# Patient Record
Sex: Male | Born: 1990 | Race: White | Hispanic: No | Marital: Single | Smoking: Current every day smoker
Health system: Southern US, Community
[De-identification: ages and names within clinical notes are randomized; demographics above are authoritative.]

---
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
## 2012-11-26 ENCOUNTER — Encounter (INDEPENDENT_AMBULATORY_CARE_PROVIDER_SITE_OTHER): Payer: Self-pay | Admitting: General Surgery

## 2012-11-27 ENCOUNTER — Ambulatory Visit (INDEPENDENT_AMBULATORY_CARE_PROVIDER_SITE_OTHER): Payer: BC Managed Care – PPO | Admitting: General Surgery

## 2012-11-27 ENCOUNTER — Encounter (INDEPENDENT_AMBULATORY_CARE_PROVIDER_SITE_OTHER): Payer: Self-pay | Admitting: General Surgery

## 2012-11-27 VITALS — BP 138/80 | HR 60 | Temp 97.6°F | Resp 14 | Ht 72.0 in | Wt 194.8 lb

## 2012-11-27 DIAGNOSIS — S76219A Strain of adductor muscle, fascia and tendon of unspecified thigh, initial encounter: Secondary | ICD-10-CM | POA: Insufficient documentation

## 2012-11-27 DIAGNOSIS — IMO0002 Reserved for concepts with insufficient information to code with codable children: Secondary | ICD-10-CM

## 2012-11-27 DIAGNOSIS — R1031 Right lower quadrant pain: Secondary | ICD-10-CM | POA: Insufficient documentation

## 2012-11-27 NOTE — Patient Instructions (Signed)
Inguinal Strain Your exam shows you have an inguinal strain. This is also known as a pulled groin. This injury is usually due to a pull or partial tear to a muscle or tendon in the groin area. Most groin pulls take several weeks to heal completely. There may be pain with lifting your leg or walking during much of your recovery. Treatment for groin strains includes:  Rest and avoid lifting or performing activities that increase your pain.  Apply ice packs for 20-30 minutes every few hours to reduce pain and swelling over the next 2-3 days.  Medicine to reduce pain and inflammation is often prescribed. HOME CARE INSTRUCTIONS  While most strains in the groin area will heal with rest, you should also watch for any signs of a more serious condition.  SEEK IMMEDIATE MEDICAL CARE IF:   You notice unusual swelling or bulging in the groin.  You have pain or swelling in the testicle.  Blood in your urine.  Marked increased pain.  Weakness or numbness of your leg or abdominal pain. MAKE SURE YOU:   Understand these instructions.  Will watch your condition.  Will get help right away if you are not doing well or get worse. Document Released: 06/13/2004 Document Revised: 07/29/2011 Document Reviewed: 09/10/2007 [employer] Patient Information 2014 [employer].  

## 2012-11-27 NOTE — Progress Notes (Signed)
Patient ID: Clayton Cruz, male   DOB: [birth year], 22 y.o.   MRN: [id number]  Chief Complaint  Patient presents with  . New Evaluation    eval RIH    HPI JUN OSMENT is a 22 y.o. male.   HPI 22 year old Caucasian male referred by [place] snow at [facility] for evaluation of right groin pain. He states that he was seen by a physician at [facility] in 3-4 weeks ago and was diagnosed with a right inguinal hernia. He saw someone most recently at [facility] and was diagnosed with a hernia as well he states. He states he started having problems about a month and a half ago. He reports pain in his right groin and testicle. He also has had some pain in his left lateral abdominal wall. He also reports bilateral groin pain which alternates. It is more noticeable while doing squats. At the gym recently after running he had an extreme amount of pressure and tightness in both groins. He felt better after stretching. He most recently was playing some basketball planted his foot and pivoted and he developed a fair amount of discomfort in his right groin. He denies burning and stinging. He really has not noticed a bulge per se. He had one episode of vomiting due to the pain. He states about 8 months ago he started power lifting. He smokes about half a pack a day. He denies any dysuria. He denies any diarrhea or constipation. He denies any difficulty urinating.  History reviewed. No pertinent past medical history.  Past Surgical History  Procedure Laterality Date  . Knee surgery Left 2006    Family History  Problem Relation Age of Onset  . Hyperlipidemia Father   . Hypertension Father   . Diabetes Maternal Grandmother     DM 2  . Diabetes Maternal Grandfather     DM 2    Social History History  Substance Use Topics  . Smoking status: Current Every Day Smoker -- 0.50 packs/day  . Smokeless tobacco: Never Used  . Alcohol Use: Yes     Comment: heavy beer and liquor    No  Known Allergies  No current outpatient prescriptions on file.   No current facility-administered medications for this visit.    Review of Systems Review of Systems  Constitutional: Negative for fever, chills, appetite change and unexpected weight change.  HENT: Negative for congestion and trouble swallowing.   Eyes: Negative for visual disturbance.  Respiratory: Negative for chest tightness and shortness of breath.   Cardiovascular: Negative for chest pain and leg swelling.       No PND, no orthopnea, no DOE  Gastrointestinal:       See HPI  Genitourinary: Negative for dysuria and hematuria.  Musculoskeletal: Negative.   Skin: Negative for rash.  Neurological: Negative for seizures and speech difficulty.  Hematological: Does not bruise/bleed easily.  Psychiatric/Behavioral: Negative for behavioral problems and confusion.    Blood pressure 138/80, pulse 60, temperature 97.6 F (36.4 C), temperature source Temporal, resp. rate 14, height 6' (1.829 m), weight 194 lb 12.8 oz (88.361 kg).  Physical Exam Physical Exam  Constitutional: He is oriented to person, place, and time. He appears well-developed and well-nourished. No distress.  HENT:  Head: Normocephalic and atraumatic.  Right Ear: External ear normal.  Left Ear: External ear normal.  Eyes: Conjunctivae are normal. No scleral icterus.  Neck: Normal range of motion. Neck supple. No tracheal deviation present. No thyromegaly present.  Cardiovascular:  Normal rate, normal heart sounds and intact distal pulses.   Pulmonary/Chest: Effort normal and breath sounds normal. No respiratory distress. He has no wheezes.  Abdominal: Soft. He exhibits no distension. There is no tenderness. There is no rebound and no guarding. Hernia confirmed negative in the right inguinal area and confirmed negative in the left inguinal area.  Genitourinary: Penis normal. Right testis shows no mass, no swelling and no tenderness. Right testis is  descended. Left testis shows no mass, no swelling and no tenderness. Left testis is descended.  Patient examined supine and standing with and without Valsalva maneuvers. Could not appreciate a bulge or enlarged inguinal ring on exam  Musculoskeletal: Normal range of motion. He exhibits no edema and no tenderness.  Lymphadenopathy:    He has no cervical adenopathy.  Neurological: He is alert and oriented to person, place, and time. He exhibits normal muscle tone.  Skin: Skin is warm and dry. No rash noted. He is not diaphoretic. No erythema. No pallor.  Psychiatric: He has a normal mood and affect. His behavior is normal. Judgment and thought content normal.    Data Reviewed Note from [place] Snow 6/27- no hernia noted on physical exam  Assessment    Right inguinal strain     Plan    I could not feel an inguinal hernia on exam with the patient laying or standing. I explained to him that I thought he had more symptoms consistent with an inguinal strain. I explained it is possible he may have a very early small inguinal hernia; However, I did not appreciate any classic hernia on physical exam.  I told him I would like to start with management for inguinal strain. He was given Agricultural engineer. I told him to avoid heavy lifting, pushing or pulling for 4-6 weeks. I told him he could take NSAIDs as needed. Followup in 6 weeks for repeat evaluation  Mary Sella. Andrey Campanile, MD, FACS General, Bariatric, & Minimally Invasive Surgery [facility] Surgery, [place]        [facility] M 11/27/2012, 1:57 PM

## 2013-01-20 ENCOUNTER — Encounter (INDEPENDENT_AMBULATORY_CARE_PROVIDER_SITE_OTHER): Payer: BC Managed Care – PPO | Admitting: General Surgery

## 2017-04-23 DIAGNOSIS — F331 Major depressive disorder, recurrent, moderate: Secondary | ICD-10-CM | POA: Insufficient documentation

## 2019-08-12 ENCOUNTER — Ambulatory Visit: Payer: Self-pay | Attending: Internal Medicine

## 2019-08-12 DIAGNOSIS — Z23 Encounter for immunization: Secondary | ICD-10-CM

## 2019-08-12 NOTE — Progress Notes (Signed)
   Covid-19 Vaccination Clinic  Name:  KENYEN CANDY    MRN: [id number] DOB: Jun 18, 1990  08/12/2019  Mr. Foor was observed post Covid-19 immunization for 15 minutes without incident. He was provided with Vaccine Information Sheet and instruction to access the V-Safe system.   Mr. Wilsey was instructed to call 911 with any severe reactions post vaccine: Marland Kitchen Difficulty breathing  . Swelling of face and throat  . A fast heartbeat  . A bad rash all over body  . Dizziness and weakness   Immunizations Administered    Name Date Dose VIS Date Route   Moderna COVID-19 Vaccine 08/12/2019  9:48 AM 0.5 mL 04/20/2019 Intramuscular   Manufacturer: Moderna   Lot: [id number]   NDC: 03403-524-81

## 2019-09-15 ENCOUNTER — Ambulatory Visit: Payer: Self-pay | Attending: Internal Medicine

## 2019-09-15 DIAGNOSIS — Z23 Encounter for immunization: Secondary | ICD-10-CM

## 2019-09-15 NOTE — Progress Notes (Signed)
   Covid-19 Vaccination Clinic  Name:  ALISTAR MCENERY    MRN: [id number] DOB: June 01, 1990  09/15/2019  Mr. Gensel was observed post Covid-19 immunization for 15 minutes without incident. He was provided with Vaccine Information Sheet and instruction to access the V-Safe system.   Mr. Vise was instructed to call 911 with any severe reactions post vaccine: Marland Kitchen Difficulty breathing  . Swelling of face and throat  . A fast heartbeat  . A bad rash all over body  . Dizziness and weakness   Immunizations Administered    Name Date Dose VIS Date Route   Moderna COVID-19 Vaccine 09/15/2019  9:53 AM 0.5 mL 04/2019 Intramuscular   Manufacturer: Moderna   Lot: [id number]   NDC: 70220-266-91

## 2020-07-22 ENCOUNTER — Emergency Department: Payer: Self-pay

## 2020-07-22 ENCOUNTER — Observation Stay
Admission: EM | Admit: 2020-07-22 | Discharge: 2020-07-23 | Disposition: A | Discharge status: Home / Self Care | Payer: Self-pay | Attending: Obstetrics and Gynecology | Admitting: Obstetrics and Gynecology

## 2020-07-22 ENCOUNTER — Other Ambulatory Visit: Payer: Self-pay

## 2020-07-22 ENCOUNTER — Encounter: Payer: Self-pay | Admitting: Emergency Medicine

## 2020-07-22 DIAGNOSIS — F17200 Nicotine dependence, unspecified, uncomplicated: Secondary | ICD-10-CM | POA: Diagnosis present

## 2020-07-22 DIAGNOSIS — G35 Multiple sclerosis: Principal | ICD-10-CM | POA: Insufficient documentation

## 2020-07-22 DIAGNOSIS — G379 Demyelinating disease of central nervous system, unspecified: Secondary | ICD-10-CM | POA: Insufficient documentation

## 2020-07-22 DIAGNOSIS — Z20822 Contact with and (suspected) exposure to covid-19: Secondary | ICD-10-CM | POA: Insufficient documentation

## 2020-07-22 DIAGNOSIS — F17210 Nicotine dependence, cigarettes, uncomplicated: Secondary | ICD-10-CM | POA: Insufficient documentation

## 2020-07-22 LAB — DIFFERENTIAL
Abs Immature Granulocytes: 0.06 10*3/uL (ref 0.00–0.07)
Basophils Absolute: 0.1 10*3/uL (ref 0.0–0.1)
Basophils Relative: 1 %
Eosinophils Absolute: 0.2 10*3/uL (ref 0.0–0.5)
Eosinophils Relative: 2 %
Immature Granulocytes: 1 %
Lymphocytes Relative: 34 %
Lymphs Abs: 4.1 10*3/uL — ABNORMAL HIGH (ref 0.7–4.0)
Monocytes Absolute: 0.7 10*3/uL (ref 0.1–1.0)
Monocytes Relative: 6 %
Neutro Abs: 6.9 10*3/uL (ref 1.7–7.7)
Neutrophils Relative %: 56 %

## 2020-07-22 LAB — CBC
HCT: 48.8 % (ref 39.0–52.0)
Hemoglobin: 16.1 g/dL (ref 13.0–17.0)
MCH: 29.5 pg (ref 26.0–34.0)
MCHC: 33 g/dL (ref 30.0–36.0)
MCV: 89.5 fL (ref 80.0–100.0)
Platelets: 214 10*3/uL (ref 150–400)
RBC: 5.45 MIL/uL (ref 4.22–5.81)
RDW: 13.6 % (ref 11.5–15.5)
WBC: 12 10*3/uL — ABNORMAL HIGH (ref 4.0–10.5)
nRBC: 0 % (ref 0.0–0.2)

## 2020-07-22 LAB — COMPREHENSIVE METABOLIC PANEL
ALT: 24 U/L (ref 0–44)
AST: 21 U/L (ref 15–41)
Albumin: 4.6 g/dL (ref 3.5–5.0)
Alkaline Phosphatase: 65 U/L (ref 38–126)
Anion gap: 8 (ref 5–15)
BUN: 18 mg/dL (ref 6–20)
CO2: 25 mmol/L (ref 22–32)
Calcium: 8.7 mg/dL — ABNORMAL LOW (ref 8.9–10.3)
Chloride: 107 mmol/L (ref 98–111)
Creatinine, Ser: 0.76 mg/dL (ref 0.61–1.24)
GFR, Estimated: >60 mL/min (ref >60)
Glucose, Bld: 106 mg/dL — ABNORMAL HIGH (ref 70–99)
Potassium: 4.1 mmol/L (ref 3.5–5.1)
Sodium: 140 mmol/L (ref 135–145)
Total Bilirubin: 0.8 mg/dL (ref 0.3–1.2)
Total Protein: 7.9 g/dL (ref 6.5–8.1)

## 2020-07-22 LAB — RESP PANEL BY RT-PCR (FLU A&B, COVID) ARPGX2
Influenza A by PCR: NEGATIVE
Influenza B by PCR: NEGATIVE
SARS Coronavirus 2 by RT PCR: NEGATIVE

## 2020-07-22 LAB — CBG MONITORING, ED: Glucose-Capillary: 122 mg/dL — ABNORMAL HIGH (ref 70–99)

## 2020-07-22 LAB — PROTIME-INR
INR: 1 (ref 0.8–1.2)
Prothrombin Time: 12.8 seconds (ref 11.4–15.2)

## 2020-07-22 LAB — VITAMIN D 25 HYDROXY (VIT D DEFICIENCY, FRACTURES): Vit D, 25-Hydroxy: 30.18 ng/mL (ref 30–100)

## 2020-07-22 LAB — HIV ANTIBODY (ROUTINE TESTING W REFLEX): HIV Screen 4th Generation wRfx: NONREACTIVE

## 2020-07-22 LAB — APTT: aPTT: 29 seconds (ref 24–36)

## 2020-07-22 IMAGING — MR MR HEAD WO/W CM
15 series · 48 of 48 positions shown · IV contrast (gadavist)
Comparison: None.

CLINICAL DATA: Left arm and leg numbness

EXAM:
MRI HEAD WITHOUT AND WITH CONTRAST
TECHNIQUE: Multiplanar, multiecho pulse sequences of the brain and surrounding
structures were obtained without and with intravenous contrast.
CONTRAST:  9mL GADAVIST GADOBUTROL 1 MMOL/ML IV SOLN

[Series 5: ax dwi_tracew · axial · 3.0mm · 0.65mm/px · z∈[-55,+100]mm · 3 of 48 slices shown]
[im 1/48]
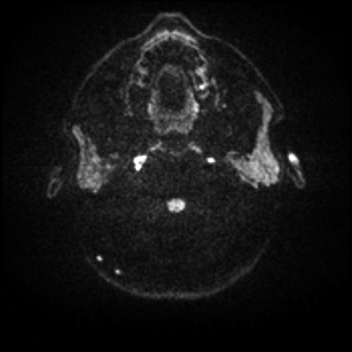
[im 24/48]
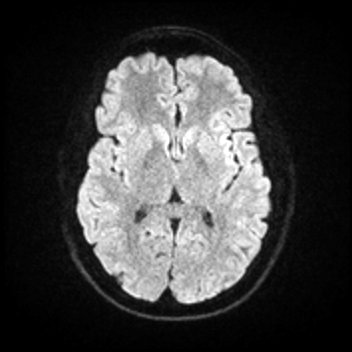
[im 48/48]
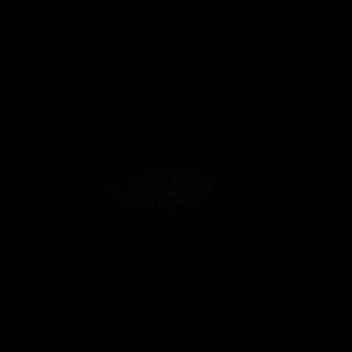

[Series 6: ax dwi_adc · axial · 3.0mm · 0.65mm/px · z∈[-55,+97]mm · 3 of 47 slices shown]
[im 1/47]
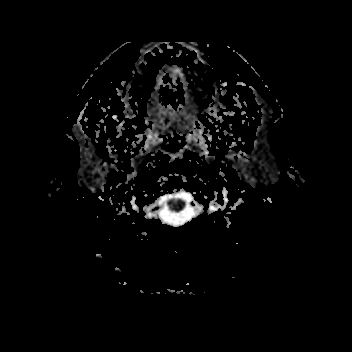
[im 24/47]
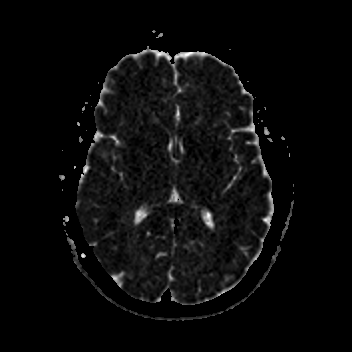
[im 47/47]
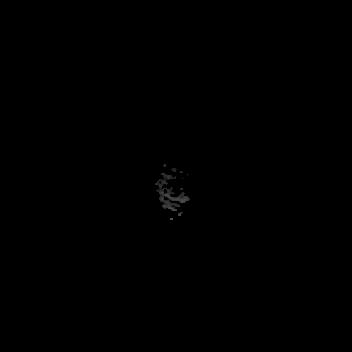

[Series 7: cor dwi_tracew · coronal · 5.0mm · 0.60mm/px · 2 of 38 slices shown]
[im 1/38]
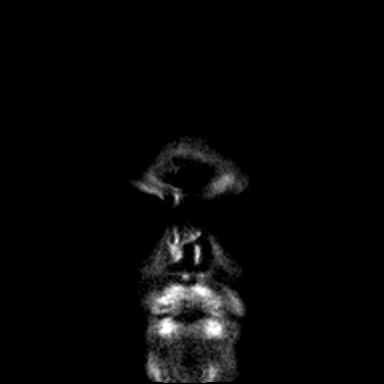
[im 38/38]
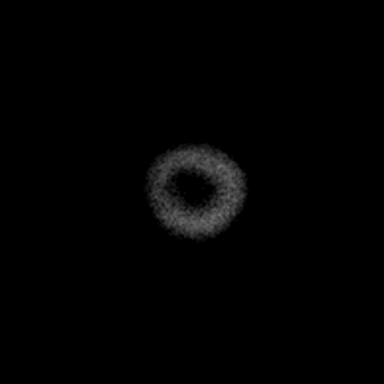

[Series 8: cor dwi_adc · coronal · 5.0mm · 0.60mm/px · 2 of 38 slices shown]
[im 1/38]
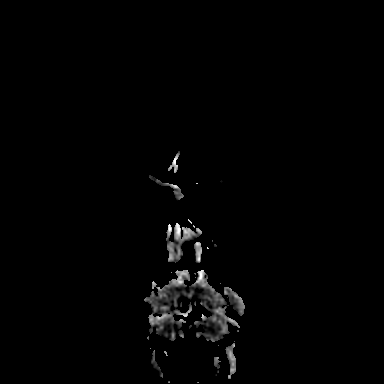
[im 38/38]
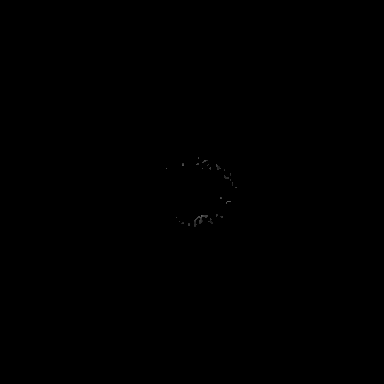

[Series 9: FLAIR · axial · 3.0mm · 0.69mm/px · z∈[-58,+101]mm · 3 of 54 slices shown (1 of 2)]
[im 1/54]
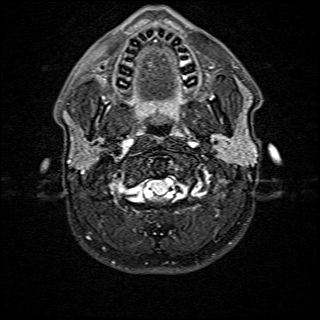
[im 27/54]
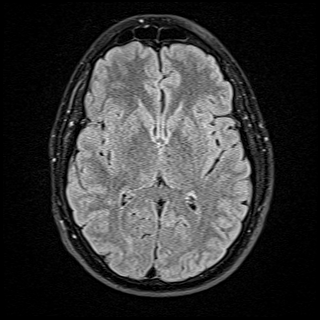
[im 54/54]
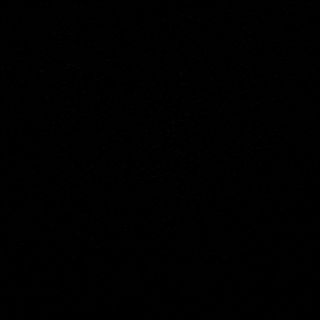

[Series 10: T1 · sagittal · 5.0mm · 0.62mm/px · 1 of 25 slices shown (1 of 2)]
[im 1/25]
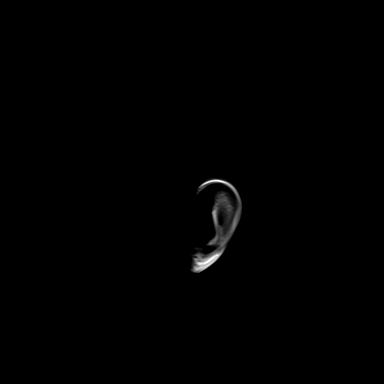

[Series 11: T2 · axial · 5.0mm · 0.53mm/px · 1 of 25 slices shown (1 of 2)]
[im 1/25]
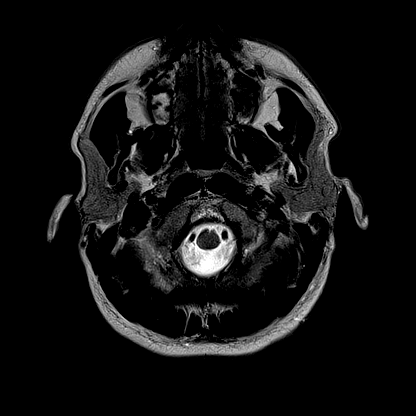

[Series 13: pha_images · axial · 3.0mm · 0.90mm/px · z∈[-65,+112]mm · 3 of 59 slices shown]
[im 1/59]
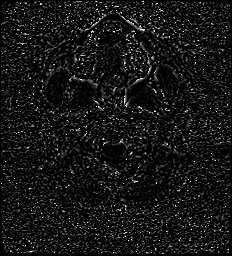
[im 30/59]
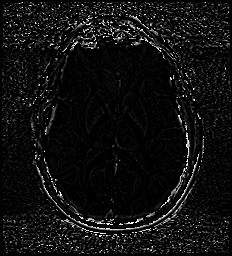
[im 59/59]
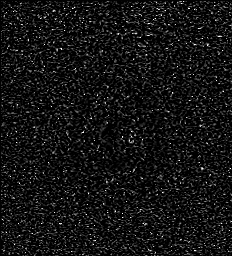

[Series 14: swi_images · axial · 3.0mm · 0.90mm/px · z∈[-65,+112]mm · 3 of 60 slices shown]
[im 1/60]
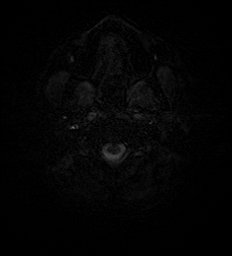
[im 30/60]
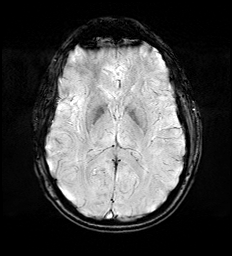
[im 60/60]
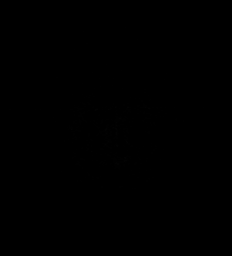

[Series 16: T1 · axial · 1.0mm · 0.98mm/px · z∈[-65,+110]mm · 8 of 176 slices shown (2 of 2)]
[im 1/176]
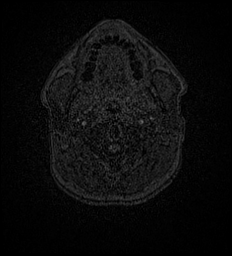
[im 26/176]
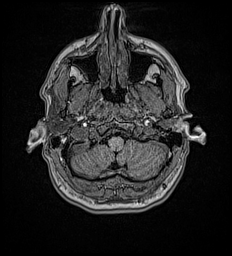
[im 51/176]
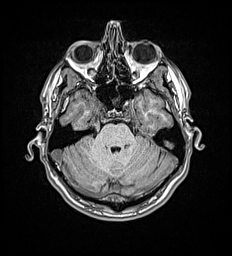
[im 76/176]
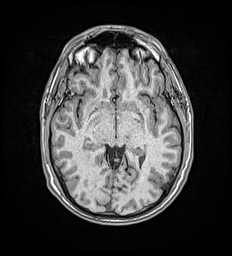
[im 101/176]
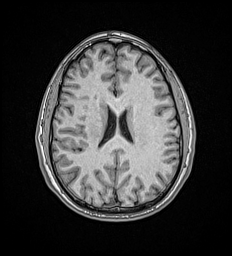
[im 126/176]
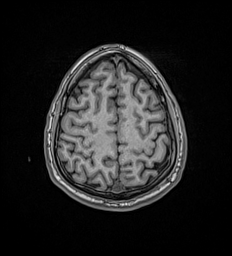
[im 151/176]
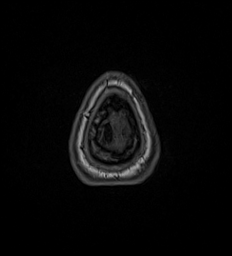
[im 176/176]
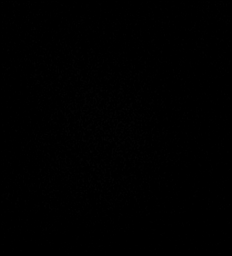

[Series 17: FLAIR · sagittal · 1.0mm · 0.98mm/px · 8 of 176 slices shown (2 of 2)]
[im 1/176]
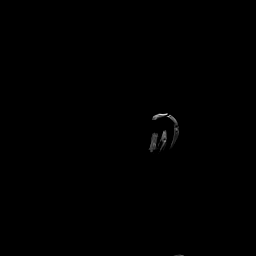
[im 26/176]
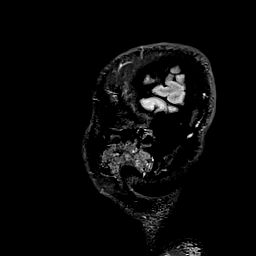
[im 51/176]
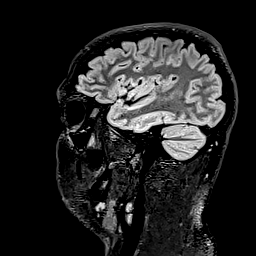
[im 76/176]
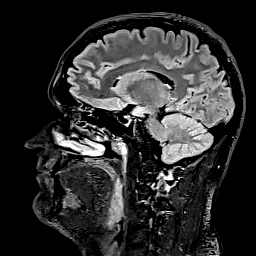
[im 101/176]
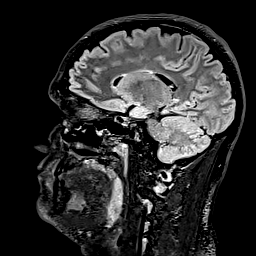
[im 126/176]
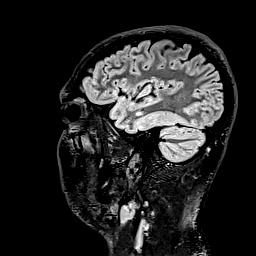
[im 151/176]
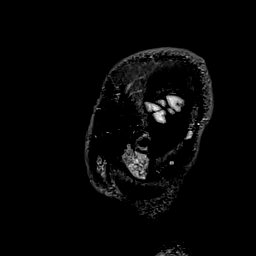
[im 176/176]
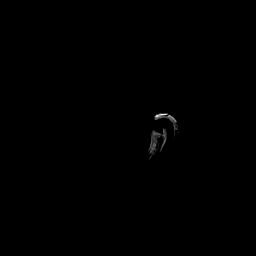

[Series 22: T2 · coronal · 5.0mm · 0.57mm/px · 1 of 31 slices shown (2 of 2)]
[im 1/31]
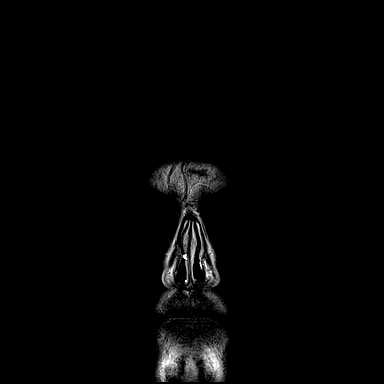

[Series 23: T1 post-contrast · axial · 1.0mm · 0.98mm/px · z∈[-45,+129]mm · 8 of 172 slices shown (1 of 3)]
[im 1/172]
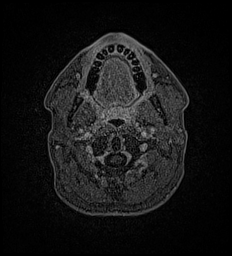
[im 25/172]
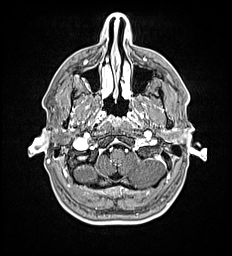
[im 49/172]
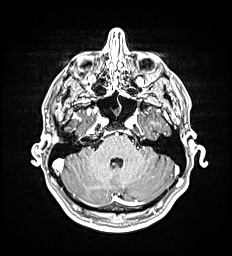
[im 74/172]
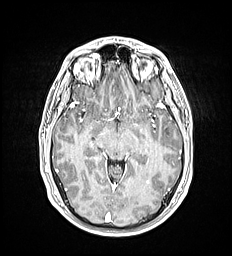
[im 98/172]
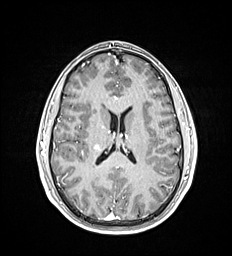
[im 123/172]
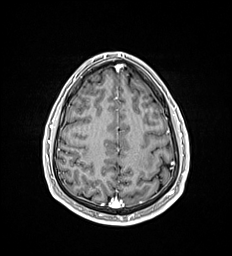
[im 147/172]
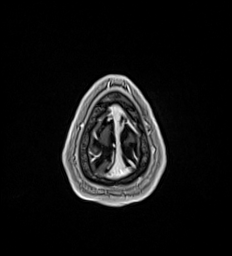
[im 172/172]
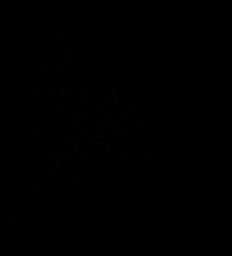

[Series 24: T1 post-contrast · coronal · 5.0mm · 0.57mm/px · 1 of 29 slices shown (2 of 3)]
[im 1/29]
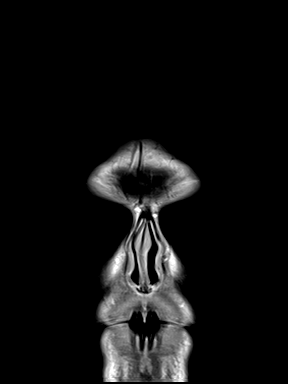

[Series 25: T1 post-contrast · sagittal · 5.0mm · 0.62mm/px · 1 of 23 slices shown (3 of 3)]
[im 1/23]
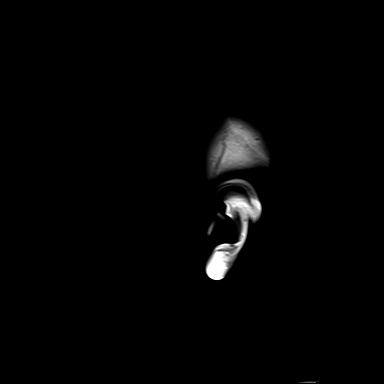

[48 of 48 positions shown; findings below may reference images not displayed]

FINDINGS: Brain: Several foci of T2 hyperintensity are present in the
supratentorial periventricular and subcortical white matter no
brainstem or cerebellar involvement identified. There is associated
enhancement of a right centrum semiovale lesion on series 23, image
114; right corona radiata lesion on image 96; left posterior
temporal lesion on image 75; left anterior temporal lesion on image
65.

There is no acute infarction or intracranial hemorrhage. There is no
intracranial mass or significant mass effect. There is no
hydrocephalus or extra-axial fluid collection. Ventricles and sulci
are normal in size and configuration.

Vascular: Major vessel flow voids at the skull base are preserved.

Skull and upper cervical spine: Normal marrow signal is preserved.

Sinuses/Orbits: Paranasal sinuses are aerated. Orbits are
unremarkable.

Other: Sella is unremarkable.  Mastoid air cells are clear.
IMPRESSION: Mild burden of supratentorial white matter lesions in a pattern
suspicious for primary demyelinating disease such as multiple
sclerosis. Some of the lesions demonstrate enhancement consistent
with active demyelination in this context.

No brainstem or cerebellar involvement.

## 2020-07-22 IMAGING — MR MR CERVICAL SPINE W/ CM
5 of 6 series · 34 of 48 positions shown · IV contrast (gadavist)
Comparison: None.

CLINICAL DATA: Left arm and leg numbness

EXAM:
MRI CERVICAL AND THORACIC WITH CONTRAST
TECHNIQUE: Multiplanar and multiecho pulse sequences of the cervical spine, to
include the craniocervical junction and cervicothoracic junction,
and the thoracic spine, were obtained with intravenous contrast.
CONTRAST:  9mL GADAVIST GADOBUTROL 1 MMOL/ML IV SOLN administered
for brain MRI prior to this study

[Series 1: T2 · sagittal · 3.0mm · 0.62mm/px · 5 of 15 slices shown (1 of 2)]
[im 1/15]
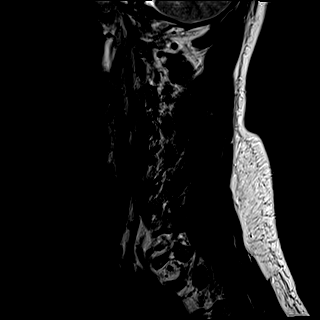
[im 4/15]
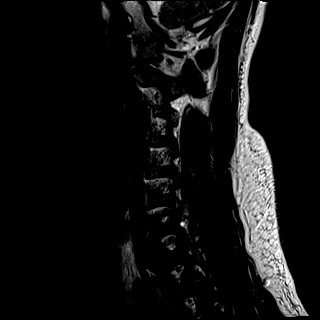
[im 8/15]
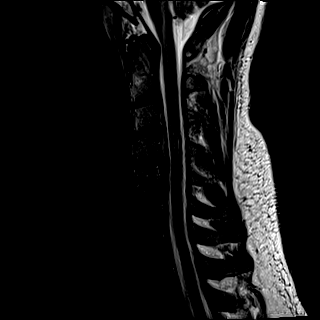
[im 11/15]
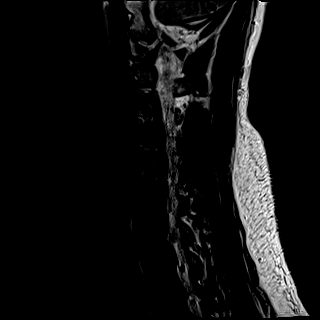
[im 15/15]
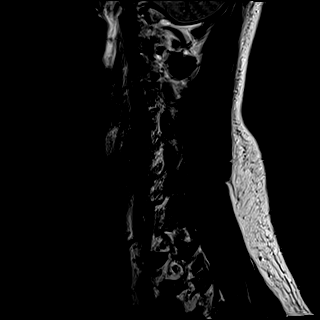

[Series 2: FLAIR fat-sat post-contrast · sagittal · 3.0mm · 0.78mm/px · 2 of 15 slices shown]
[im 1/15]
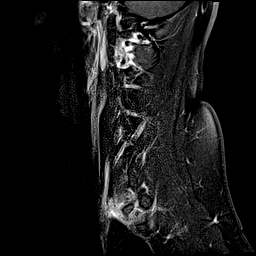
[im 4/15]
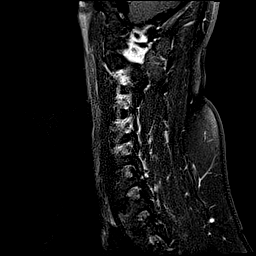

[Series 3: STIR · sagittal · 3.0mm · 0.62mm/px · 5 of 15 slices shown]
[im 1/15]
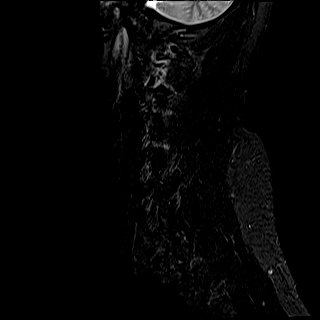
[im 4/15]
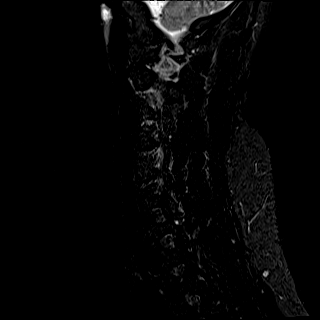
[im 8/15]
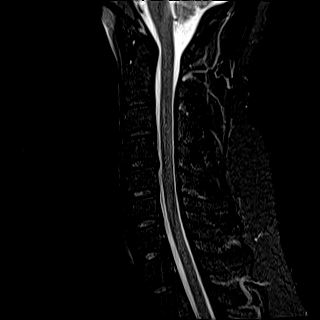
[im 11/15]
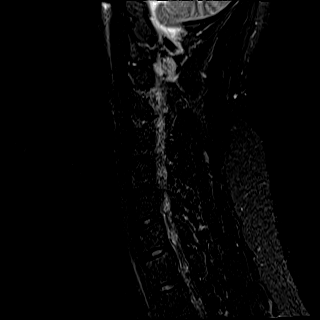
[im 15/15]
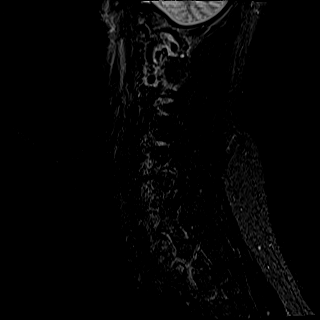

[Series 6: T1 post-contrast · axial · 3.0mm · 0.35mm/px · z∈[-74,+24]mm · 11 of 29 slices shown]
[im 1/29]
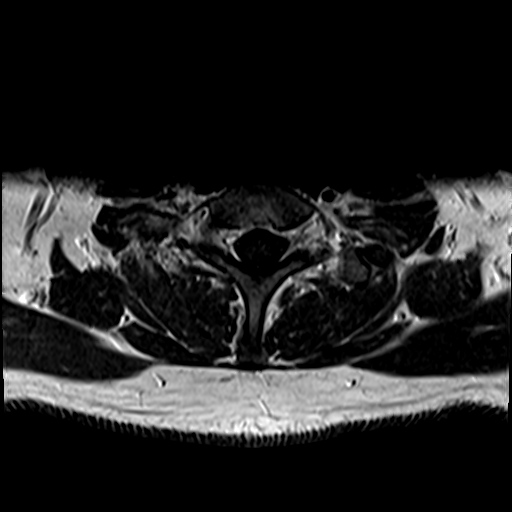
[im 3/29]
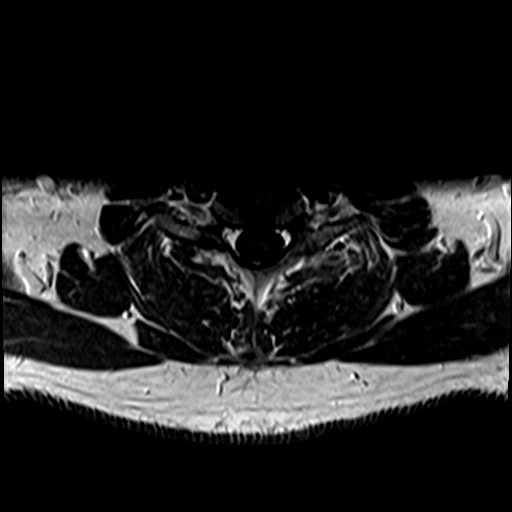
[im 6/29]
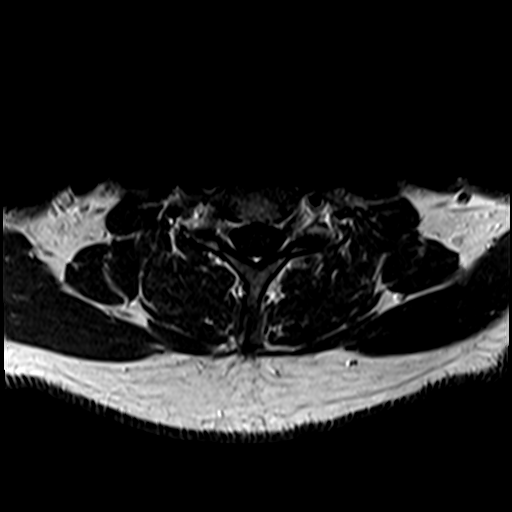
[im 9/29]
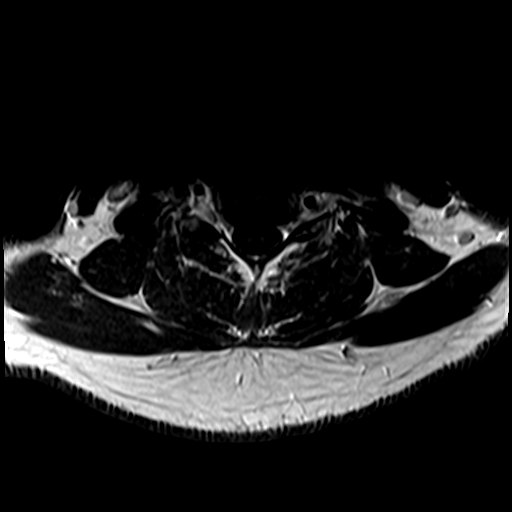
[im 12/29]
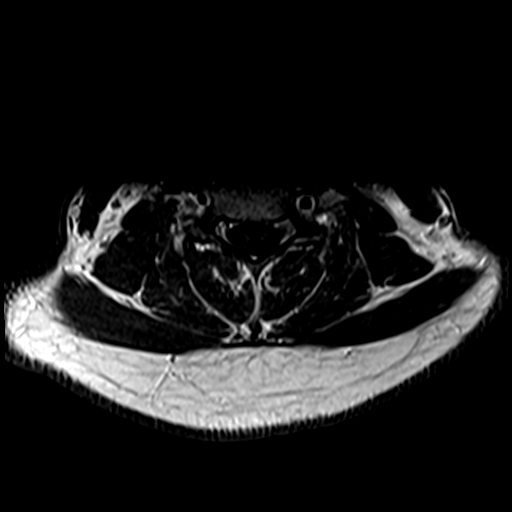
[im 15/29]
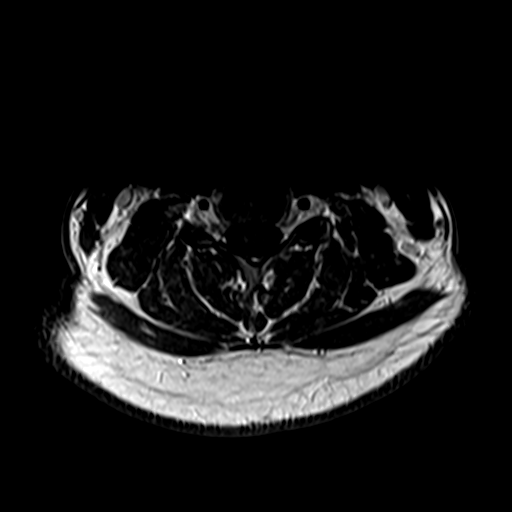
[im 17/29]
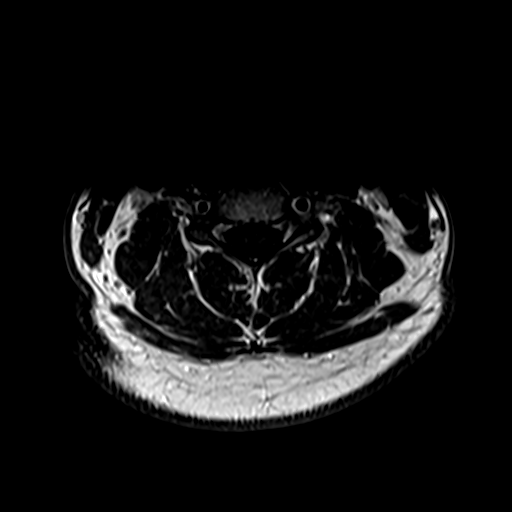
[im 20/29]
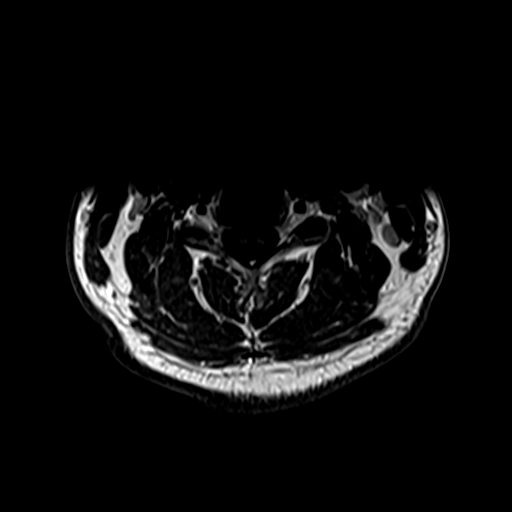
[im 23/29]
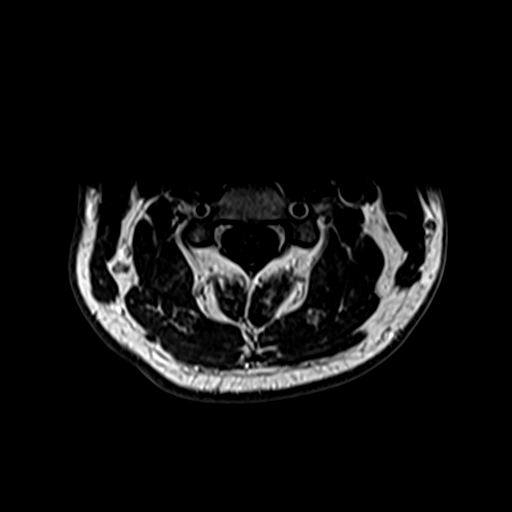
[im 26/29]
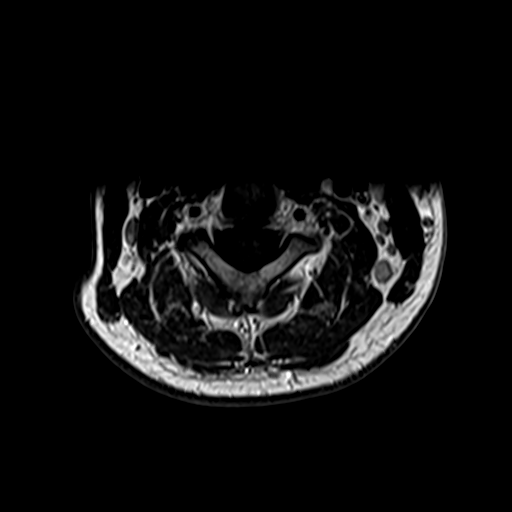
[im 29/29]
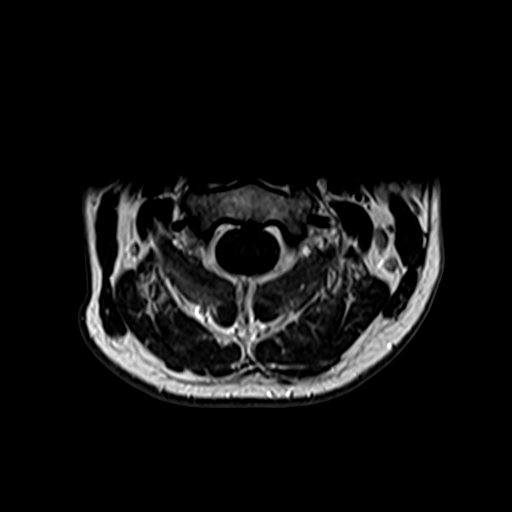

[Series 7: T2 · axial · 3.0mm · 0.70mm/px · z∈[-74,+24]mm · 11 of 29 slices shown (2 of 2)]
[im 1/29]
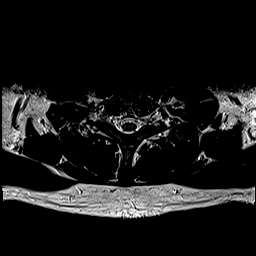
[im 3/29]
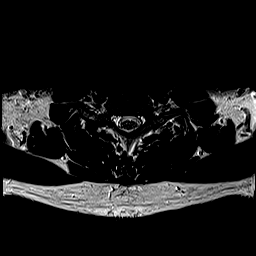
[im 6/29]
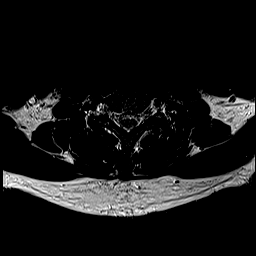
[im 9/29]
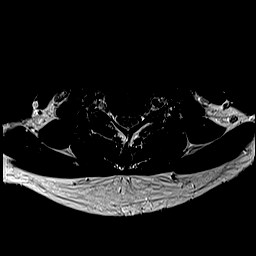
[im 12/29]
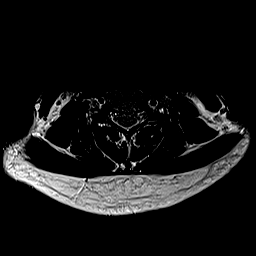
[im 15/29]
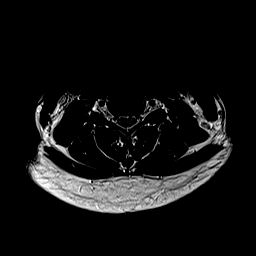
[im 17/29]
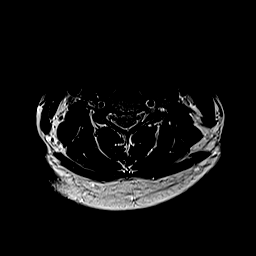
[im 20/29]
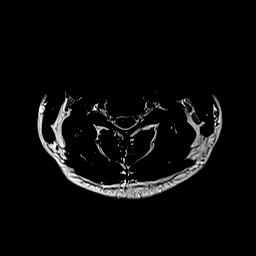
[im 23/29]
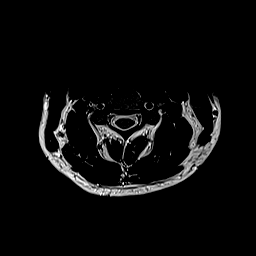
[im 26/29]
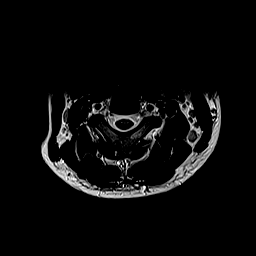
[im 29/29]
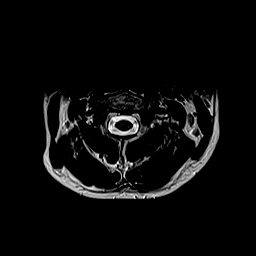

[34 of 48 positions shown; findings below may reference images not displayed]

FINDINGS: MRI CERVICAL SPINE FINDINGS

Alignment: Preserved.

Vertebrae: Vertebral body heights are maintained.

Cord: No abnormal signal or enhancement.

Posterior Fossa, vertebral arteries, paraspinal tissues:
Intracranial findings are discussed separately. Otherwise
unremarkable.

Disc levels: Intervertebral disc heights and signal are maintained.
Minor disc bulge at C4-C5. No canal or foraminal stenosis at any
level.

MRI THORACIC SPINE

Alignment:  Preserved.

Vertebrae: Vertebral body heights are maintained.

Cord:  No definite abnormal signal.  No abnormal enhancement.

Paraspinal and other soft tissues: Unremarkable.

Disc levels: Intervertebral disc heights and signal are maintained.
There is no significant disc herniation. No canal or foraminal
stenosis at any level.
IMPRESSION: No abnormal cord signal or enhancement.

## 2020-07-22 IMAGING — MR MR THORACIC SPINE W/ CM
6 of 7 series · 32 of 48 positions shown · IV contrast (gadavist)
Comparison: None.

CLINICAL DATA: Left arm and leg numbness

EXAM:
MRI CERVICAL AND THORACIC WITH CONTRAST
TECHNIQUE: Multiplanar and multiecho pulse sequences of the cervical spine, to
include the craniocervical junction and cervicothoracic junction,
and the thoracic spine, were obtained with intravenous contrast.
CONTRAST:  9mL GADAVIST GADOBUTROL 1 MMOL/ML IV SOLN administered
for brain MRI prior to this study

[Series 18: T1 · sagittal · 6.0mm · 1.88mm/px · 1 of 9 slices shown]
[im 1/9]
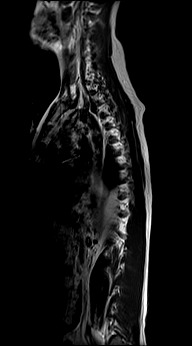

[Series 19: T2 · sagittal · 3.0mm · 1.33mm/px · 4 of 17 slices shown (1 of 2)]
[im 1/17]
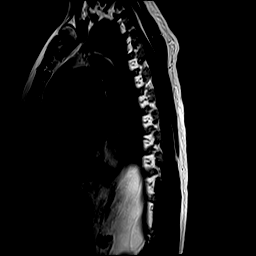
[im 6/17]
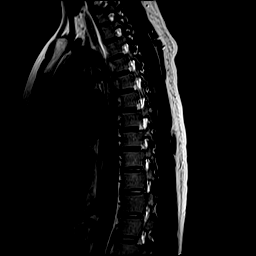
[im 11/17]
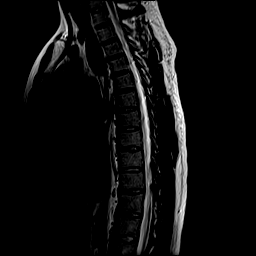
[im 17/17]
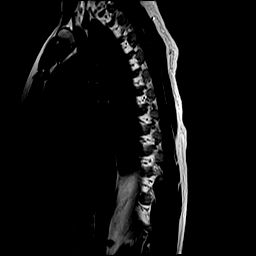

[Series 20: T1 fat-sat post-contrast · sagittal · 3.0mm · 1.33mm/px · 5 of 17 slices shown]
[im 1/17]
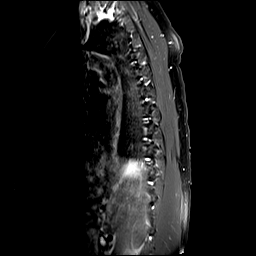
[im 5/17]
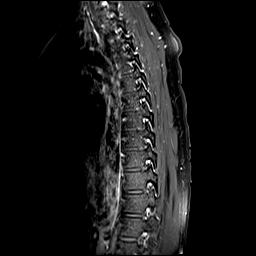
[im 9/17]
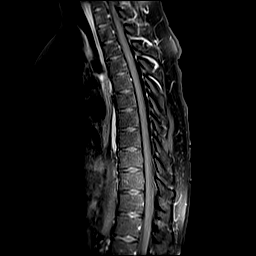
[im 13/17]
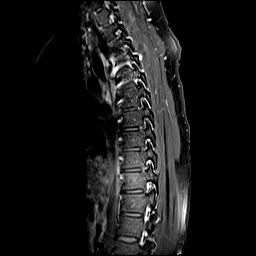
[im 17/17]
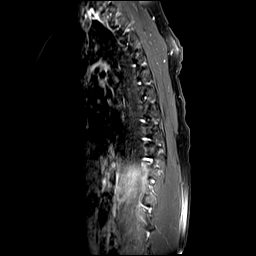

[Series 21: STIR · sagittal · 3.0mm · 0.66mm/px · 5 of 17 slices shown]
[im 1/17]
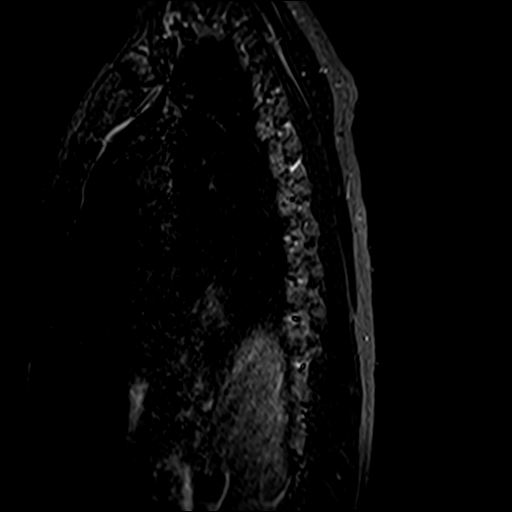
[im 5/17]
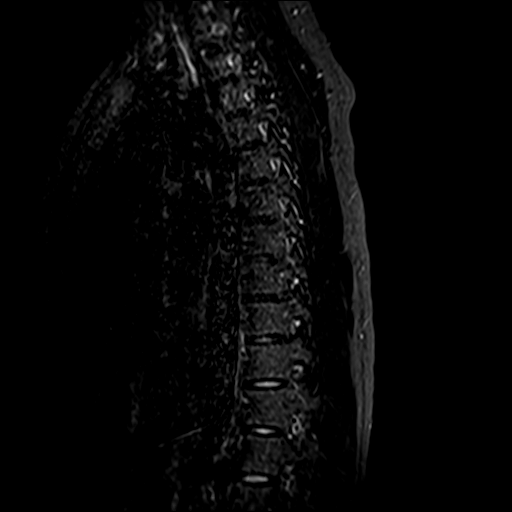
[im 9/17]
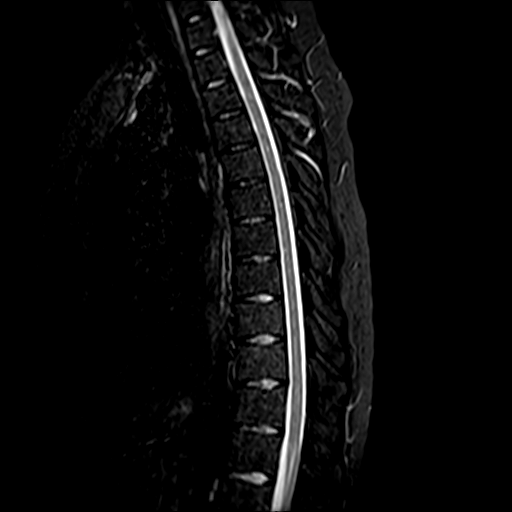
[im 13/17]
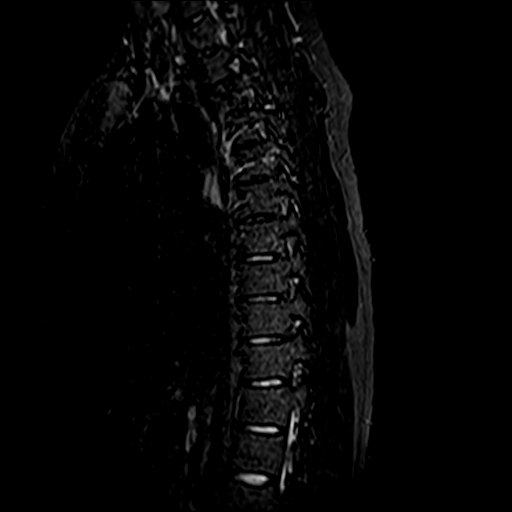
[im 17/17]
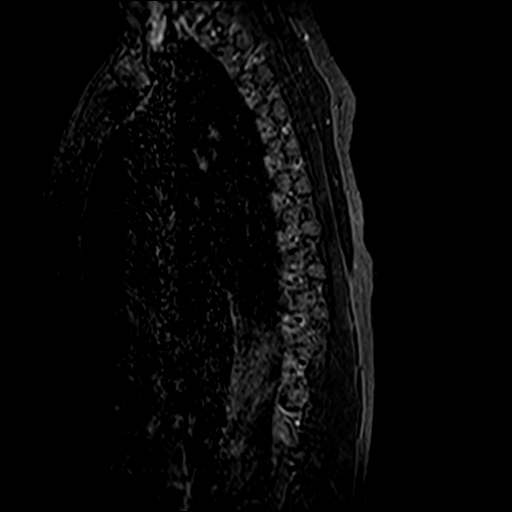

[Series 22: T2 · axial · 4.0mm · 0.59mm/px · z∈[-379,-102]mm · 11 of 39 slices shown (2 of 2)]
[im 1/39]
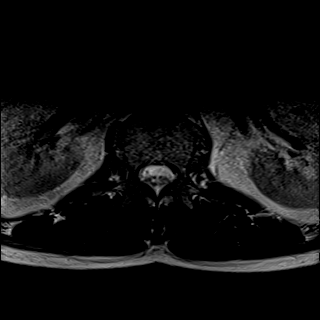
[im 4/39]
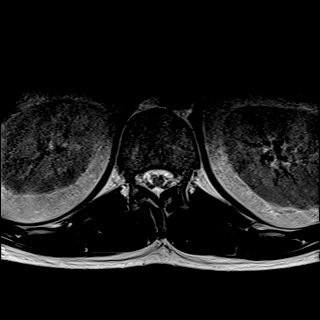
[im 8/39]
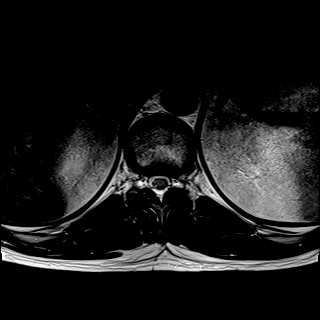
[im 12/39]
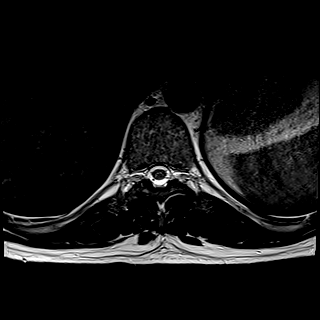
[im 16/39]
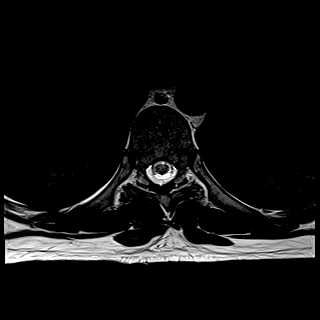
[im 20/39]
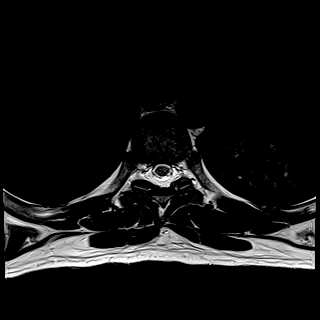
[im 23/39]
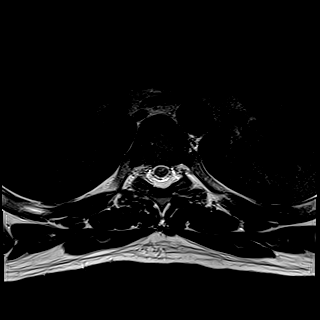
[im 27/39]
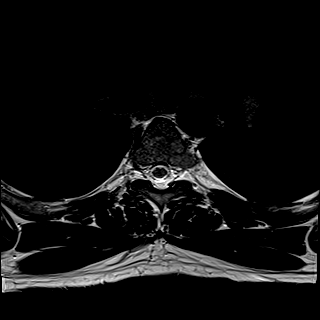
[im 31/39]
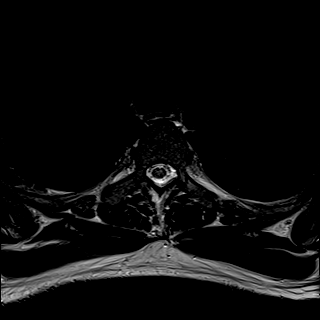
[im 35/39]
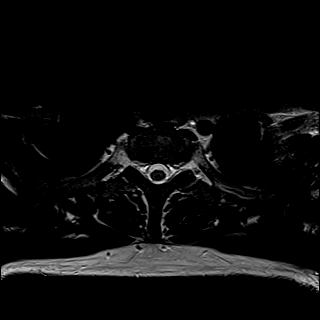
[im 39/39]
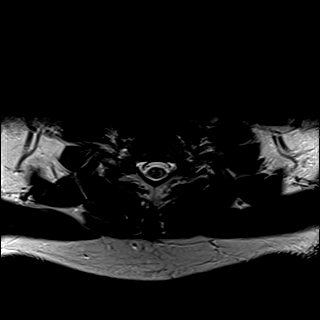

[Series 24: T1 post-contrast · axial · 4.0mm · 0.37mm/px · z∈[-379,-176]mm · 6 of 39 slices shown]
[im 1/39]
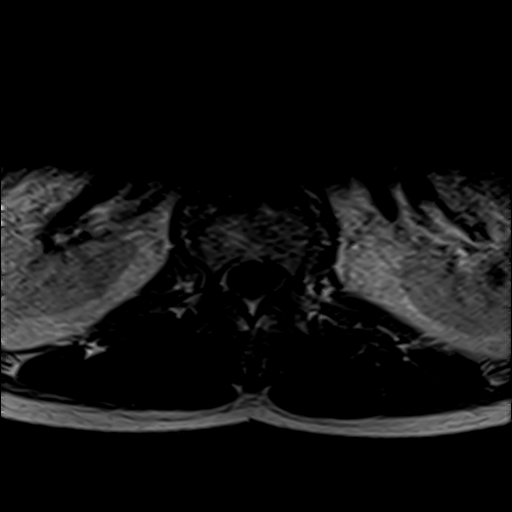
[im 8/39]
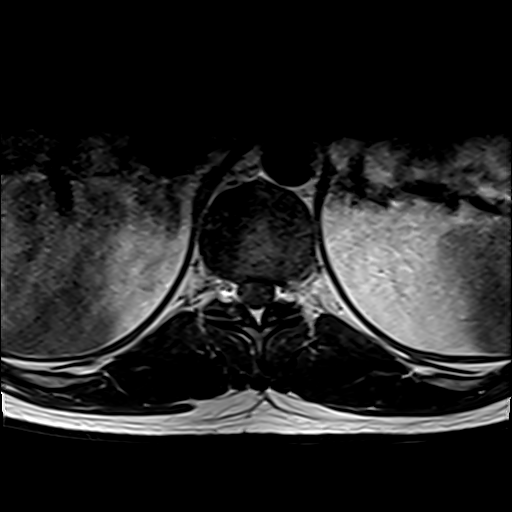
[im 12/39]
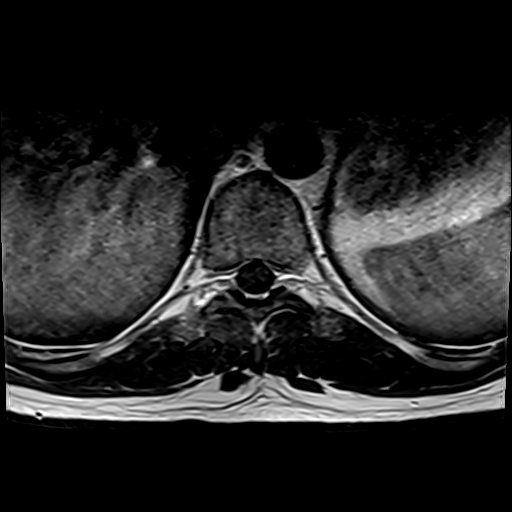
[im 16/39]
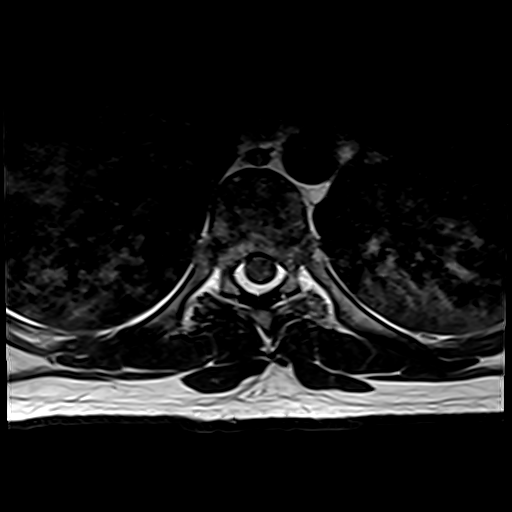
[im 23/39]
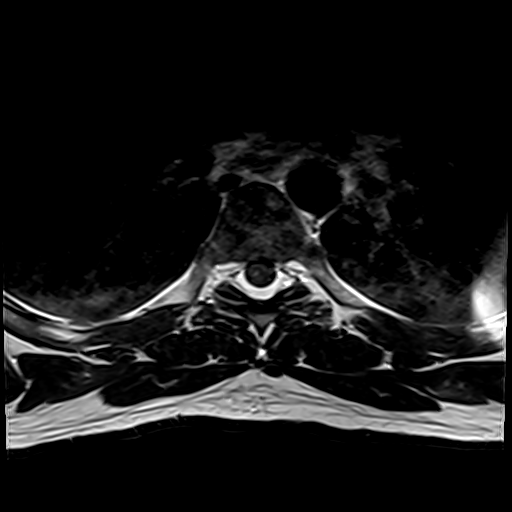
[im 27/39]
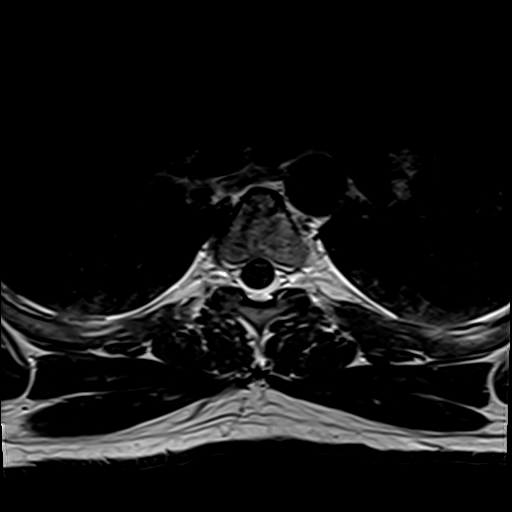

[32 of 48 positions shown; findings below may reference images not displayed]

FINDINGS: MRI CERVICAL SPINE FINDINGS

Alignment: Preserved.

Vertebrae: Vertebral body heights are maintained.

Cord: No abnormal signal or enhancement.

Posterior Fossa, vertebral arteries, paraspinal tissues:
Intracranial findings are discussed separately. Otherwise
unremarkable.

Disc levels: Intervertebral disc heights and signal are maintained.
Minor disc bulge at C4-C5. No canal or foraminal stenosis at any
level.

MRI THORACIC SPINE

Alignment:  Preserved.

Vertebrae: Vertebral body heights are maintained.

Cord:  No definite abnormal signal.  No abnormal enhancement.

Paraspinal and other soft tissues: Unremarkable.

Disc levels: Intervertebral disc heights and signal are maintained.
There is no significant disc herniation. No canal or foraminal
stenosis at any level.
IMPRESSION: No abnormal cord signal or enhancement.

## 2020-07-22 IMAGING — CT CT HEAD W/O CM
3 series · 16 of 47 positions shown, 19 images · non-contrast
Comparison: None.

CLINICAL DATA: Left-sided numbness since yesterday morning

EXAM:
CT HEAD WITHOUT CONTRAST
TECHNIQUE: Contiguous axial images were obtained from the base of the skull
through the vertex without intravenous contrast.

[Series 2: head wo · axial · 0.40mm/px · z∈[-100,+25]mm · 10 of 30 slices shown, 13 images]
[im 3/30  brain]
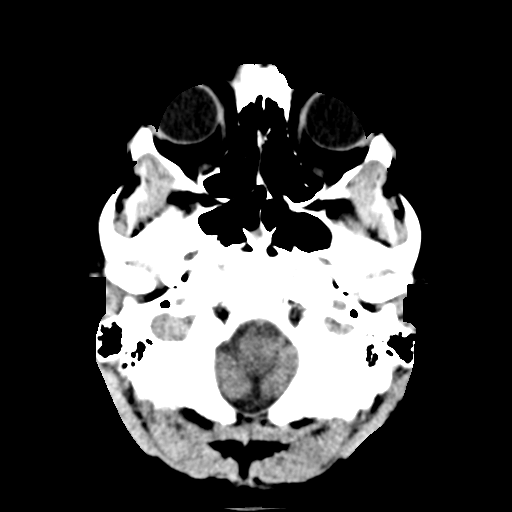
[im 3/30  bone]
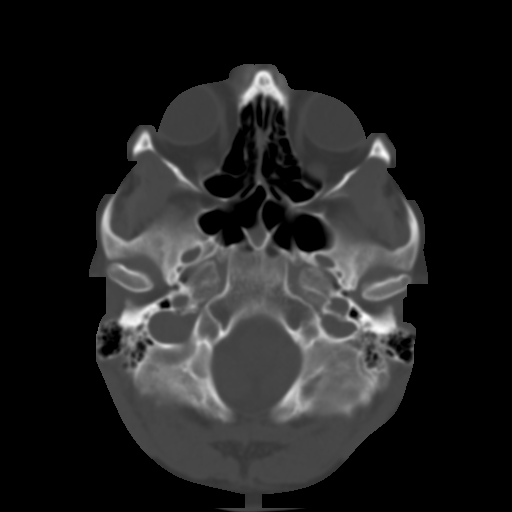
[im 6/30  brain]
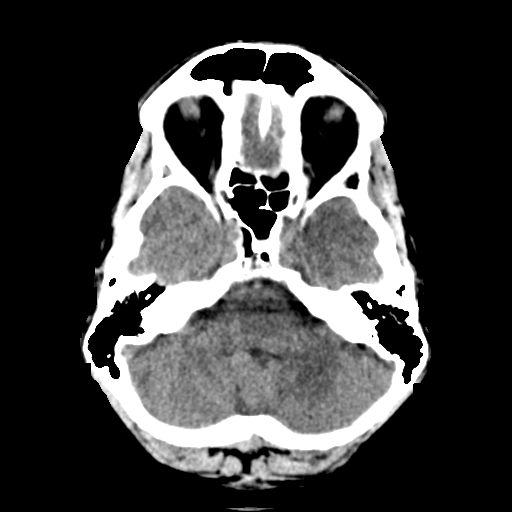
[im 9/30  brain]
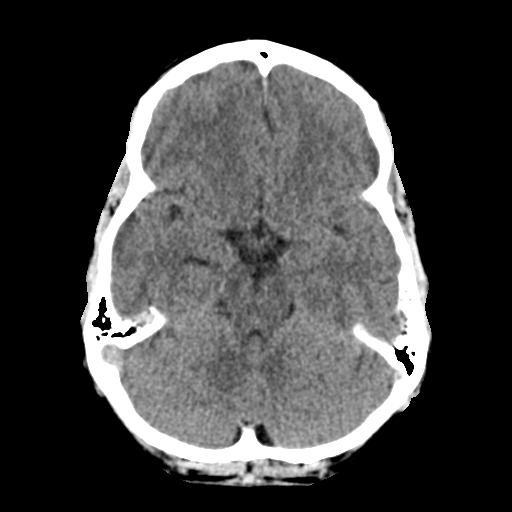
[im 11/30  brain]
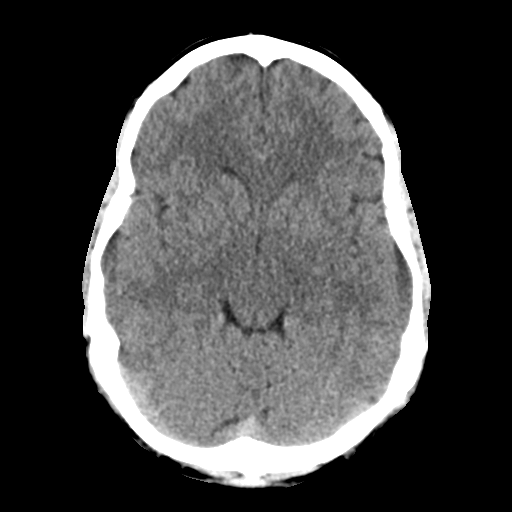
[im 14/30  brain]
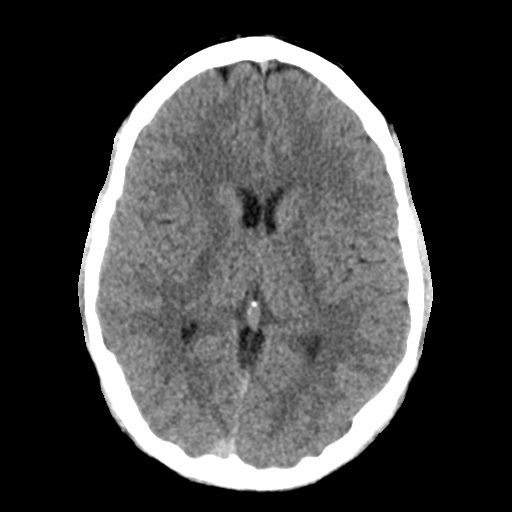
[im 14/30  bone]
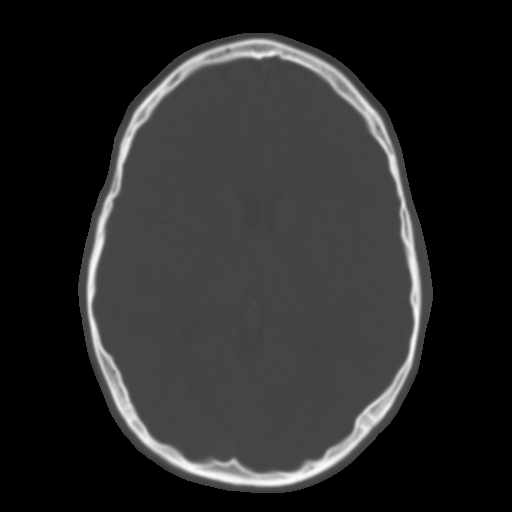
[im 17/30  brain]
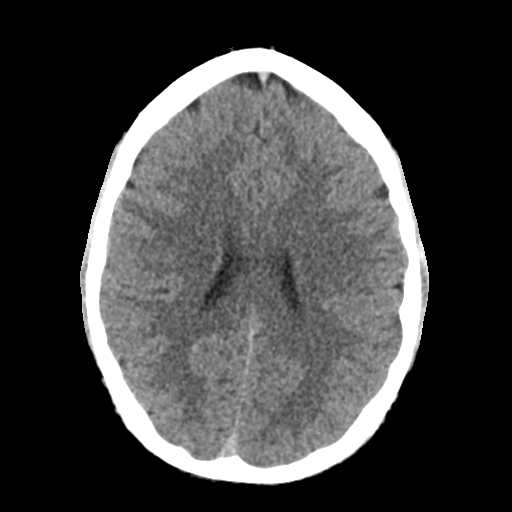
[im 20/30  brain]
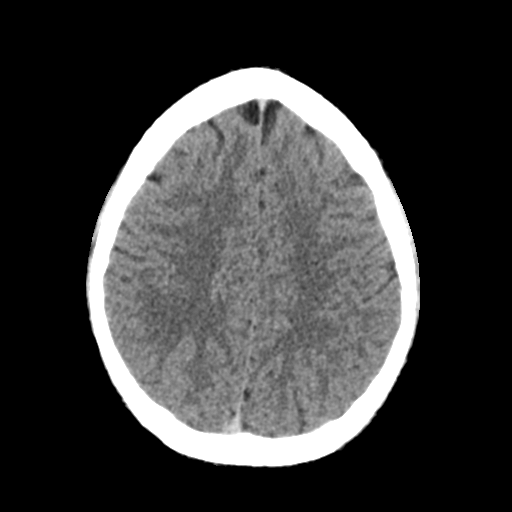
[im 23/30  brain]
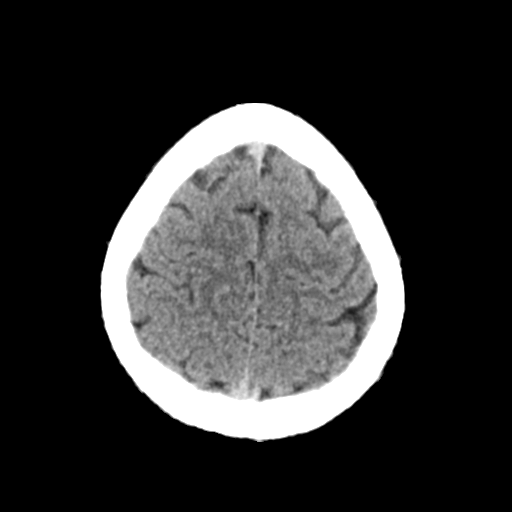
[im 25/30  brain]
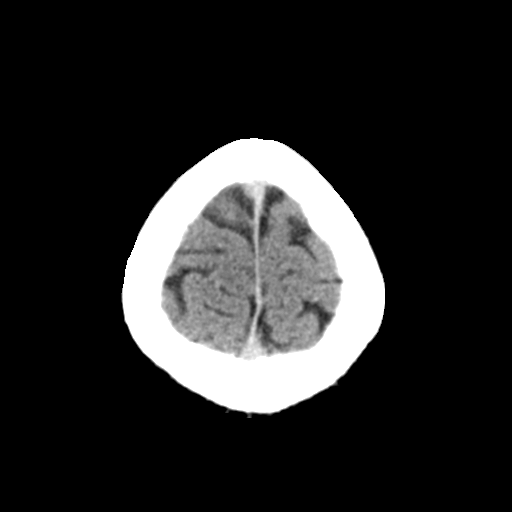
[im 25/30  bone]
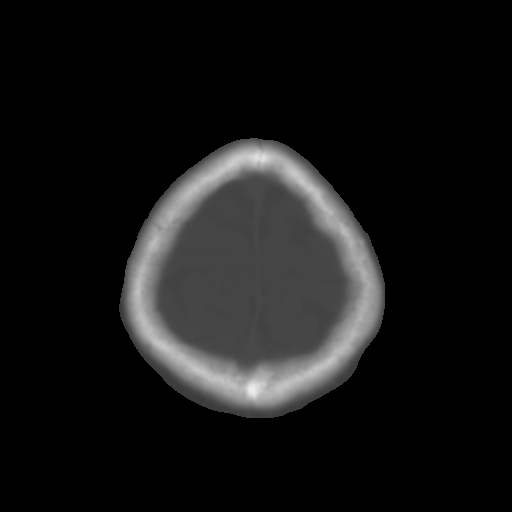
[im 28/30  brain]
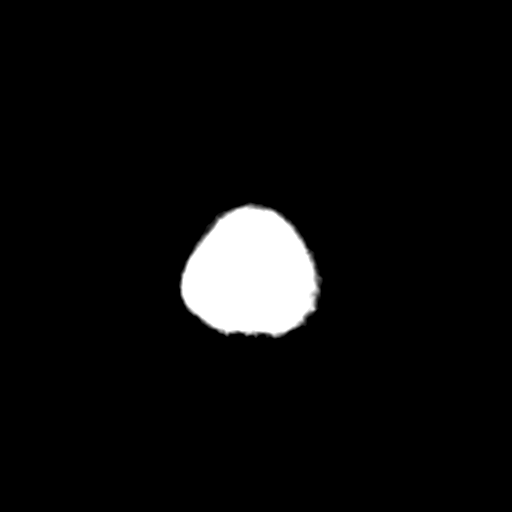

[Series 4: coronal soft tissue · coronal · 0.31mm/px · 3 of 64 slices shown]
[im 22/64  brain]
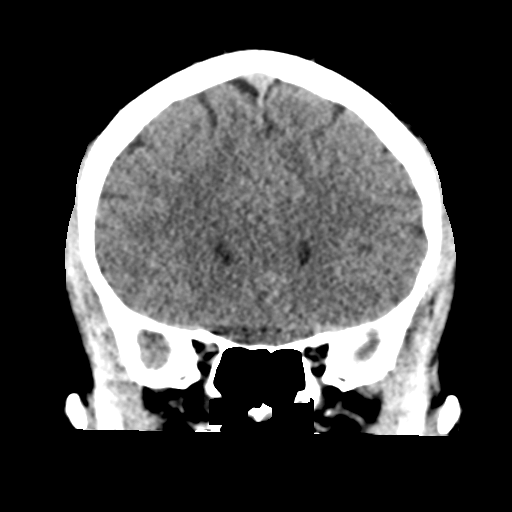
[im 29/64  brain]
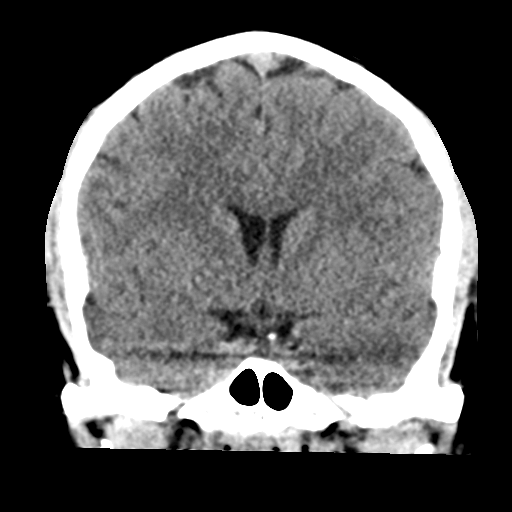
[im 36/64  brain]
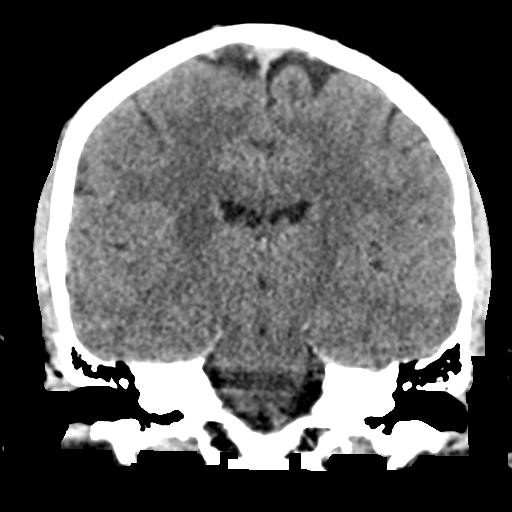

[Series 5: sagittal soft tissue · sagittal · 0.31mm/px · 3 of 54 slices shown]
[im 18/54  brain]
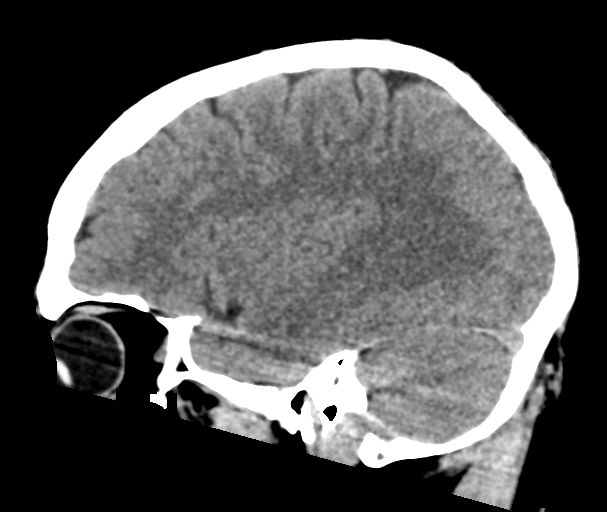
[im 27/54  brain]
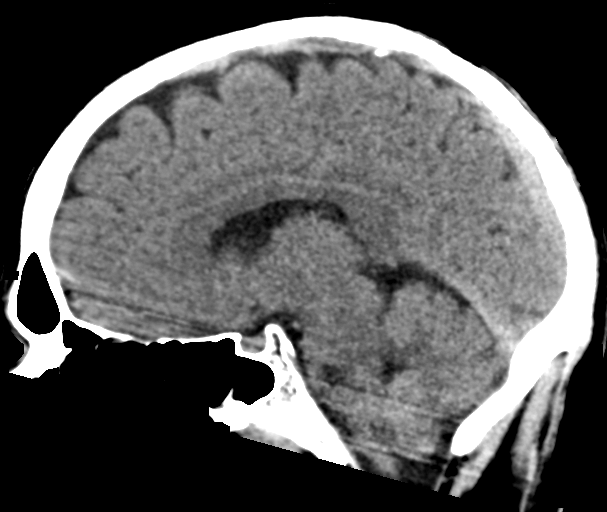
[im 36/54  brain]
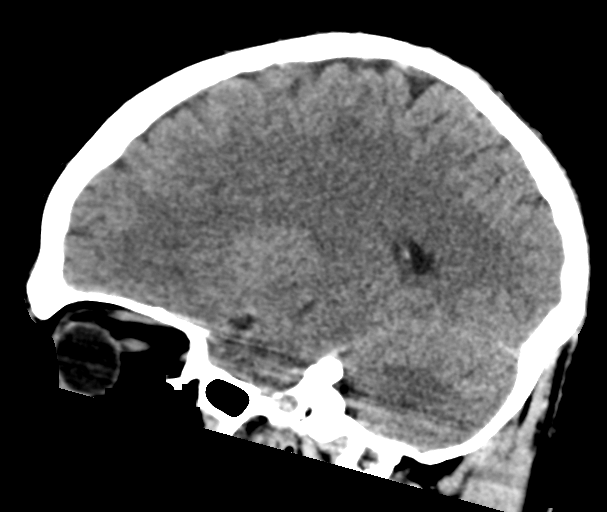

[16 of 47 positions shown; findings below may reference images not displayed]

FINDINGS: Brain: No evidence of acute infarction, hemorrhage, hydrocephalus,
extra-axial collection or mass lesion/mass effect.

Vascular: No hyperdense vessel or unexpected calcification.

Skull: Normal. Negative for fracture or focal lesion.

Sinuses/Orbits: No acute finding.

Other: None
IMPRESSION: Negative

## 2020-07-22 MED ORDER — GADOBUTROL 1 MMOL/ML IV SOLN
9.0000 mL | Freq: Once | INTRAVENOUS | Status: AC | PRN
  Administered 2020-07-22: 9 mL via INTRAVENOUS

## 2020-07-22 MED ORDER — ACETAMINOPHEN 650 MG RE SUPP: 650.0000 mg | Freq: Four times a day (QID) | RECTAL | Status: DC | PRN

## 2020-07-22 MED ORDER — SODIUM CHLORIDE 0.9% FLUSH
3.0000 mL | Freq: Two times a day (BID) | INTRAVENOUS | Status: DC
  Administered 2020-07-22 – 2020-07-23 (×3): 3 mL via INTRAVENOUS

## 2020-07-22 MED ORDER — SODIUM CHLORIDE 0.9 % IV SOLN
1000.0000 mg | INTRAVENOUS | Status: DC
  Administered 2020-07-22: 1000 mg via INTRAVENOUS
  Filled 2020-07-22 (×2): qty 8

## 2020-07-22 MED ORDER — ONDANSETRON HCL 4 MG PO TABS: 4.0000 mg | ORAL_TABLET | Freq: Four times a day (QID) | ORAL | Status: DC | PRN

## 2020-07-22 MED ORDER — SODIUM CHLORIDE 0.9 % IV SOLN: 250.0000 mL | INTRAVENOUS | Status: DC | PRN

## 2020-07-22 MED ORDER — ONDANSETRON HCL 4 MG/2ML IJ SOLN: 4.0000 mg | Freq: Four times a day (QID) | INTRAMUSCULAR | Status: DC | PRN

## 2020-07-22 MED ORDER — ACETAMINOPHEN 325 MG PO TABS: 650.0000 mg | ORAL_TABLET | Freq: Four times a day (QID) | ORAL | Status: DC | PRN

## 2020-07-22 MED ORDER — PANTOPRAZOLE SODIUM 40 MG PO TBEC
40.0000 mg | DELAYED_RELEASE_TABLET | Freq: Every day | ORAL | Status: DC
  Administered 2020-07-22 – 2020-07-23 (×2): 40 mg via ORAL
  Filled 2020-07-22 (×2): qty 1

## 2020-07-22 MED ORDER — SODIUM CHLORIDE 0.9% FLUSH: 3.0000 mL | INTRAVENOUS | Status: DC | PRN

## 2020-07-22 MED ORDER — ENOXAPARIN SODIUM 40 MG/0.4ML ~~LOC~~ SOLN
40.0000 mg | SUBCUTANEOUS | Status: DC
  Filled 2020-07-22: qty 0.4

## 2020-07-22 MED ORDER — CALCIUM CITRATE-VITAMIN D 500-500 MG-UNIT PO CHEW: 2.0000 | CHEWABLE_TABLET | Freq: Every day | ORAL | Status: DC

## 2020-07-22 MED ORDER — SODIUM CHLORIDE 0.9% FLUSH
3.0000 mL | Freq: Once | INTRAVENOUS | Status: DC
Start: 2020-07-22 — End: 2020-07-23

## 2020-07-22 NOTE — Consult Note (Signed)
NEUROLOGY CONSULTATION NOTE   Date of service: July 22, 2020 Patient Name: Clayton Cruz MRN:  [id number] DOB:  1991-01-24 Reason for consult: "left sided weakness and numbness" _ _ _   _ __   _ __ _ _  __ __   _ __   __ _  History of Present Illness  Clayton Cruz is a 30 y.o. male with no significant PMH who presents with left sided numbness and weakness with a LKW of 07/20/20 night. He went to bed and woke up with numbness and tingling in left arm which improved. He has some numbness in his left forearm. Also reports that he fell when he was going down stairs and has a patch of numbness in his left foot. Endorses some nausea. No prior hx of strokes. Has family history of both MS and strokes. Reports sometimes, he gets some arm numbness, mostly on the left that he attributes to just sleeping on his arm. Endorses prior episode of vertigo with stpinning but not too bad and went away.  Denies any fever, had a temperature of 99.4 at home last week but no fever. No cough, some nosebleed a few days ago but nothing bad. Endorses mild tingling on urinating from time to time but nothing bad. No hiking in the last few months. 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  Endorses a mild headache but not someone who gets a lot of headaches, no hx of migraines.   ROS   Constitutional Denies weight loss, fever and chills.   HEENT Denies changes in vision and hearing.   Respiratory Denies SOB and cough.   CV Denies palpitations and CP   GI Denies abdominal pain, nausea, vomiting and diarrhea.   GU Denies dysuria and urinary frequency.   MSK Denies myalgia and joint pain.   Skin Denies rash and pruritus.   Neurological Denies headache and syncope.   Psychiatric Denies recent changes in mood. Denies anxiety and depression.    Past History  History reviewed. No pertinent past medical history. Past Surgical History:  Procedure Laterality Date   . KNEE SURGERY Left 2006   Family History  Problem Relation Age of Onset  . Hyperlipidemia Father   . Hypertension Father   . Diabetes Maternal Grandmother        DM 2  . Diabetes Maternal Grandfather        DM 2   Social History   Socioeconomic History  . Marital status: Single    Spouse name: Not on file  . Number of children: Not on file  . Years of education: Not on file  . Highest education level: Not on file  Occupational History  . Not on file  Tobacco Use  . Smoking status: Current Every Day Smoker    Packs/day: 0.50  . Smokeless tobacco: Never Used  Substance and Sexual Activity  . Alcohol use: Yes    Comment: heavy beer and liquor  . Drug use: No  . Sexual activity: Not on file  Other Topics Concern  . Not on file  Social History Narrative  . Not on file   Social Determinants of Health   Financial Resource Strain: Not on file  Food Insecurity: Not on file  Transportation Needs: Not on file  Physical Activity: Not on file  Stress: Not on file  Social Connections: Not on file   No Known Allergies  Medications  (  Not in a hospital admission)    Vitals   Vitals:   07/22/20 0957 07/22/20 1030  BP: (!) 153/98 (!) 142/79  Pulse: (!) 101 77  Resp: 18 (!) 22  Temp: 98.2 F (36.8 C)   TempSrc: Oral   SpO2: 99% 98%     There is no height or weight on file to calculate BMI.  Physical Exam   General: Laying comfortably in bed; in no acute distress.  HENT: Normal oropharynx and mucosa. Normal external appearance of ears and nose.  Neck: Supple, no pain or tenderness  CV: No JVD. No peripheral edema.  Pulmonary: Symmetric Chest rise. Normal respiratory effort.  Abdomen: Soft to touch, non-tender.  Ext: No cyanosis, edema, or deformity  Skin: No rash. Normal palpation of skin.   Musculoskeletal: Normal digits and nails by inspection. No clubbing.  Neurologic Examination  Mental status/Cognition: Alert, oriented to self, place, month and year,  good attention. Speech/language: Fluent, comprehension intact, object naming intact, repetition intact. Cranial nerves:   CN II Pupils equal and reactive to light, no VF deficits    CN III,IV,VI EOMI intact, gaze evoked end gaze nystagmus that is direction changing with horizontal gaze.   CN V normal sensation in V1, V2, and V3 segments bilaterally    CN VII no asymmetry, no nasolabial fold flattening    CN VIII normal hearing to speech    CN IX & X normal palatal elevation, no uvular deviation   CN XI 5/5 head turn and 5/5 shoulder shrug bilaterally   CN XII midline tongue protrusion   Motor:  Muscle bulk:normal, tone: normal, pronator drift mild ? In LUE tremor none Mvmt Root Nerve  Muscle Right Left Comments  SA C5/6 Ax Deltoid 5 5   EF C5/6 Mc Biceps 5 5   EE C6/7/8 Rad Triceps 5 5   WF C6/7 Med FCR 5 5   WE C7/8 PIN ECU 5 5   F Ab C8/T1 U ADM/FDI 5 5   HF L1/2/3 Fem Illopsoas 5 5 Mild weakness on L hip flexion thou comapred to the Right.  KE L2/3/4 Fem Quad 5 5   DF L4/5 D Peron Tib Ant 5 5   PF S1/2 Tibial Grc/Sol 5 5    Reflexes:  Right Left Comments  Pectoralis      Biceps (C5/6) 2 2   Brachioradialis (C5/6) 2 2    Triceps (C6/7) 2 2    Patellar (L3/4) 3 3    Achilles (S1) 2 2    Hoffman      Plantar     Jaw jerk    Sensation:  Light touch Decreased patch of sensory deficit in left lower face, anteromedial forearm and left anteromedial foot.   Pin prick Decreased in left face, forearm and L foot.   Temperature    Vibration   Proprioception    Coordination/Complex Motor:  - Finger to Nose with missed nose with LUE. Mild ataxia. - Heel to shin with ataxia in LLE. - Rapid alternating movement are normal - Gait: Deferred.  Labs   CBC:  Recent Labs  Lab 07/22/20 1000  WBC 12.0*  NEUTROABS 6.9  HGB 16.1  HCT 48.8  MCV 89.5  PLT 214    Basic Metabolic Panel:  Lab Results  Component Value Date   NA 140 07/22/2020   K 4.1 07/22/2020   CO2 25  07/22/2020   GLUCOSE 106 (H) 07/22/2020   BUN 18 07/22/2020   CREATININE 0.76  07/22/2020   CALCIUM 8.7 (L) 07/22/2020   GFRNONAA >60 07/22/2020   Lipid Panel: No results found for: LDLCALC HgbA1c: No results found for: HGBA1C Urine Drug Screen: No results found for: LABOPIA, COCAINSCRNUR, LABBENZ, AMPHETMU, THCU, LABBARB  Alcohol Level No results found for: ETH  MRI Brain w and w/o contrast:  Mild burden of supratentorial white matter lesions in a pattern suspicious for primary demyelinating disease such as multiple sclerosis. Some of the lesions demonstrate enhancement consistent with active demyelination in this context.  MR Cervical spine w and w/o contrast: No abnormal cord signal or enhancement.  MR T spine w and w/o contrast: No abnormal cord signal or enhancement.  Impression   Clayton Cruz is a 30 y.o. male with no significant PMH who presents with an episode of L arm numbness and L leg numbness with very mild L sided incoordination that developed over the course of 24-48 hours. His presentation with left sided symptoms coupled with the MRI findings are most concerning for Multiple Sclerosis. This seems to be his first attack consistent with a clinically isolated syndrome. However, he does have more than 2 (multiple) lesions on MRI which fullifls the Mcdonalds criteria for separation in space and has simlutaneous presence of enhancing and non enhancing lesions fulfilling the criteria for separation in time.  MRI C and T spine with and without contrast with no cord signal abnormality, no enhancing lesions. Low concern for NMOSD or MOGAD.  Recommendations  - IV Solumedrol 1000mg  Q24 hours x 2 days for 3/5 amd 3/6. - Recommend PO Methylprednisone 1250mg  daily x 3 days on 3/7, 3/8, 3/9. - I ordered Protonix 40mg  PO daily x 5 days while on steroids - I ordered Vit D levels. - Recommend follow up with Neurology outpatient in 1-2 weeks to discuss disease-modifying  therapy. ______________________________________________________________________   Thank you for the opportunity to take part in the care of this patient. If you have any further questions, please contact the neurology consultation attending.  Signed,  5/8 [facility] Pager Number [phone] _ _ _   _ __   _ __ _ _  __ __   _ __   __ _

## 2020-07-22 NOTE — ED Notes (Signed)
Pt ambulatory to restroom down hall at this time, NAD/steady gait noted, RR even and unlabored at this time.

## 2020-07-22 NOTE — ED Provider Notes (Signed)
[facility] Emergency Department Provider Note ____________________________________________   Event Date/Time   First MD Initiated Contact with Patient 07/22/20 1013     (approximate)  I have reviewed the triage vital signs and the nursing notes.   HISTORY  Chief Complaint Numbness    HPI Clayton Cruz is a 30 y.o. male with PMH as noted below who presents with left-sided numbness and weakness intermittently since yesterday morning.  The patient states that he awoke yesterday morning with numbness and tingling to the left arm.  Subsequently it improved but then worsened again and he started to have numbness and tingling in the left leg.  Intermittently he also experience some decreased grip strength on the left as well as difficulty lifting the left leg and felt like he was stumbling.  He later started to have some difficulty in word finding.  This morning he has no weakness but still reports numbness and tingling to the left side of the face, left arm, and left leg.  He had an associated mild headache yesterday.  He denies any prior history of similar symptoms.  He reports increased stress recently as he is currently going through a custody dispute and testified for 6 hours in court 2 days ago.   History reviewed. No pertinent past medical history.  Patient Active Problem List   Diagnosis Date Noted  . Right inguinal pain 11/27/2012  . Inguinal strain 11/27/2012    Past Surgical History:  Procedure Laterality Date  . KNEE SURGERY Left 2006    Prior to Admission medications   Not on File    Allergies Patient has no known allergies.  Family History  Problem Relation Age of Onset  . Hyperlipidemia Father   . Hypertension Father   . Diabetes Maternal Grandmother        DM 2  . Diabetes Maternal Grandfather        DM 2    Social History Social History   Tobacco Use  . Smoking status: Current Every Day Smoker    Packs/day: 0.50  .  Smokeless tobacco: Never Used  Substance Use Topics  . Alcohol use: Yes    Comment: heavy beer and liquor  . Drug use: No    Review of Systems  Constitutional: No fever. Eyes: No visual changes. ENT: No sore throat. Cardiovascular: Denies chest pain. Respiratory: Denies shortness of breath. Gastrointestinal: No vomiting or diarrhea.  Genitourinary: Negative for dysuria.  Musculoskeletal: Negative for back pain. Skin: Negative for rash. Neurological: Positive for left-sided numbness.   ____________________________________________   PHYSICAL EXAM:  VITAL SIGNS: ED Triage Vitals [07/22/20 0957]  Enc Vitals Group     BP (!) 153/98     Pulse Rate (!) 101     Resp 18     Temp 98.2 F (36.8 C)     Temp Source Oral     SpO2 99 %     Weight      Height      Head Circumference      Peak Flow      Pain Score      Pain Loc      Pain Edu?      Excl. in GC?     Constitutional: Alert and oriented. Well appearing and in no acute distress. Eyes: Conjunctivae are normal.  EOMI.  PERRLA. Head: Atraumatic. Nose: No congestion/rhinnorhea. Mouth/Throat: Mucous membranes are moist.   Neck: Normal range of motion.  Cardiovascular: Normal rate, regular rhythm.  Good peripheral circulation. Respiratory: Normal respiratory effort.  No retractions.  Gastrointestinal: No distention.  Musculoskeletal: Extremities warm and well perfused.  Neurologic:  Normal speech and language.  5/5 motor strength to all extremities.  Normal gait.  No facial droop.  Decreased sensation to left face, distal left arm, and distal left leg. Skin:  Skin is warm and dry. No rash noted. Psychiatric: Mood and affect are normal. Speech and behavior are normal.  ____________________________________________   LABS (all labs ordered are listed, but only abnormal results are displayed)  Labs Reviewed  CBC - Abnormal; Notable for the following components:      Result Value   WBC 12.0 (*)    All other  components within normal limits  DIFFERENTIAL - Abnormal; Notable for the following components:   Lymphs Abs 4.1 (*)    All other components within normal limits  COMPREHENSIVE METABOLIC PANEL - Abnormal; Notable for the following components:   Glucose, Bld 106 (*)    Calcium 8.7 (*)    All other components within normal limits  CBG MONITORING, ED - Abnormal; Notable for the following components:   Glucose-Capillary 122 (*)    All other components within normal limits  RESP PANEL BY RT-PCR (FLU A&B, COVID) ARPGX2  PROTIME-INR  APTT  NEUROMYELITIS OPTICA AUTOAB, IGG  ANTI-MYELIN ASSOC GLYCOP IGG  VITAMIN D 25 HYDROXY (VIT D DEFICIENCY, FRACTURES)  CBG MONITORING, ED   ____________________________________________  EKG  ED ECG REPORT I, Dionne Bucy, the attending physician, personally viewed and interpreted this ECG.  Date: 07/22/2020 EKG Time: 0954 Rate: 93 Rhythm: normal sinus rhythm QRS Axis: normal Intervals: normal ST/T Wave abnormalities: normal Narrative Interpretation: no evidence of acute ischemia  ____________________________________________  RADIOLOGY  CT head: No ICH or other acute abnormality MR brain: White matter demyelinating lesions  ____________________________________________   PROCEDURES  Procedure(s) performed: No  Procedures  Critical Care performed: No ____________________________________________   INITIAL IMPRESSION / ASSESSMENT AND PLAN / ED COURSE  Pertinent labs & imaging results that were available during my care of the patient were reviewed by me and considered in my medical decision making (see chart for details).  30 year old male with PMH as noted above but no significant neurologic history presents with left-sided numbness since yesterday as well as some intermittent weakness.  At this time, he has no further weakness.  He denies any prior history of similar symptoms.  On exam, the patient is well-appearing.  His vital  signs are normal except for mild hypertension.  Neurologic exam shows no motor deficits but he does report decreased sensation to the left face, left distal arm, and left distal leg.  Differential includes complex migraine versus less likely CVA or TIA.  CT head shows no acute abnormality.  We will obtain lab work-up, consult neurology, and reassess.  ----------------------------------------- 11:13 AM on 07/22/2020 -----------------------------------------  I consulted Dr. Derry Lory from neurology who recommends an MRI of the brain and states he will evaluate the patient.  ----------------------------------------- 1:53 PM on 07/22/2020 -----------------------------------------  MRI shows demyelinating lesions consistent with new onset MS.  Due to the acute symptoms, neurology recommends admission for IV steroids.  These have been ordered.  I consulted Dr. Joylene Igo from the hospitalist service for admission.  ____________________________________________   FINAL CLINICAL IMPRESSION(S) / ED DIAGNOSES  Final diagnoses:  Demyelinating changes in brain [facility])      NEW MEDICATIONS STARTED DURING THIS VISIT:  New Prescriptions   No medications on file     Note:  This document was prepared using Dragon voice recognition software and may include unintentional dictation errors.    Dionne Bucy, MD 07/22/20 1354

## 2020-07-22 NOTE — ED Notes (Signed)
Pt back from CT at this time 

## 2020-07-22 NOTE — ED Notes (Signed)
Pt given meal tray at this time 

## 2020-07-22 NOTE — ED Triage Notes (Signed)
Pt in w/L side numbness and decreased sensation, states LSN 3/3 at 2230. States he woke up yesterday morning with L arm numbness - thought he'd just slept wrong. Throughout day, numbness and tingling progressed to LLE, and he reported slurred speech during the afternoon. Speech is clear today, L arm and leg numbness still present, but no weakness. No visual changes

## 2020-07-22 NOTE — ED Notes (Addendum)
Pt to CT at this time. Pt ambulatory into wheelchair to CT with no difficulty noted. Steady gait, NAD noted at this time

## 2020-07-22 NOTE — H&P (Signed)
History and Physical    Clayton Cruz RAQ:[id number] DOB: Nov 13, 1990 DOA: 07/22/2020  PCP: System, Provider Not In   Patient coming from: Home  I have personally briefly reviewed patient's old medical records in [facility]  Chief Complaint: Left-sided numbness  HPI: Clayton Cruz is a 30 y.o. male with medical history significant for nicotine dependence who presents to the emergency room for evaluation of a 1 day history of left arm/leg numbness as well as numbness involving the left side of his face.  Symptoms started 1 day prior to his admission and he thought he had a pinched nerve.  Its been constant but waxing and waning in severity.  He states he had an episode of slurred speech which resolved but denies having any trouble swallowing, no blurred vision or weakness involving any of his extremities.  He called to make an appointment with a chiropractor thinking he had a pinched nerve but was advised to come to the ER for evaluation. He denies having any headache, no fever, no chills, no nausea, no vomiting, no abdominal pain, no dizziness, no lightheadedness, no palpitations, no diaphoresis, no chest pain, no shortness of breath, no urinary frequency, no nocturia, no dysuria. Labs show sodium 140, potassium 4.1, chloride 107, bicarb 25, glucose 106, BUN 18, creatinine 0.76, calcium 8.7, alkaline phosphatase 65, albumin 4.6, AST 21, ALT 24, total protein 7.9, white count 12.0, hemoglobin 16.1, hematocrit 48.8, MCV 89.5, RDW 13.6, platelet count 214, PT 12.8, INR 1.0 CT scan of the head without contrast shows no acute findings MRI of the brain without contrast shows mild burden of supratentorial white matter lesions in a pattern suspicious for primary demyelinating disease such as multiple sclerosis. Some of the lesions demonstrate enhancement consistent with active demyelination in this context. No brainstem or cerebellar involvement. MRI of the cervical/thoracic spine shows no abnormal  cord signal or enhancement. Twelve-lead EKG reviewed by me shows sinus rhythm with marked sinus arrhythmia   ED Course: Patient is a 30 year old Caucasian male with a history of nicotine dependence who presents to the ER for evaluation of a 1 day history of left-sided numbness involving his left upper/lower extremity as well as the left side of his face which has waxed and waned over the last 24 hours.  He did have a transient episode of dysarthria which has resolved.  MRI of the brain showed demyelinating lesions consistent with multiple sclerosis.  Neurology was consulted in the ER and patient will be referred to observation status for further evaluation.   Review of Systems: As per HPI otherwise all other systems reviewed and negative.    History reviewed. No pertinent past medical history.  Past Surgical History:  Procedure Laterality Date  . KNEE SURGERY Left 2006     reports that he has been smoking. He has been smoking about 0.50 packs per day. He has never used smokeless tobacco. He reports current alcohol use. He reports that he does not use drugs.  No Known Allergies  Family History  Problem Relation Age of Onset  . Hyperlipidemia Father   . Hypertension Father   . Diabetes Maternal Grandmother        DM 2  . Diabetes Maternal Grandfather        DM 2      Prior to Admission medications   Not on File    Physical Exam: Vitals:   07/22/20 0957 07/22/20 1030 07/22/20 1100 07/22/20 1413  BP: (!) 153/98 (!) 142/79 (!) 157/87  Pulse: (!) 101 77 84   Resp: 18 (!) 22 (!) 21   Temp: 98.2 F (36.8 C)     TempSrc: Oral     SpO2: 99% 98% 98%   Weight:    81.6 kg  Height:    6' (1.829 m)     Vitals:   07/22/20 0957 07/22/20 1030 07/22/20 1100 07/22/20 1413  BP: (!) 153/98 (!) 142/79 (!) 157/87   Pulse: (!) 101 77 84   Resp: 18 (!) 22 (!) 21   Temp: 98.2 F (36.8 C)     TempSrc: Oral     SpO2: 99% 98% 98%   Weight:    81.6 kg  Height:    6' (1.829 m)       Constitutional: Alert and oriented x 3 . Not in any apparent distress HEENT:      Head: Normocephalic and atraumatic.         Eyes: PERLA, EOMI, Conjunctivae are normal. Sclera is non-icteric.       Mouth/Throat: Mucous membranes are moist.       Neck: Supple with no signs of meningismus. Cardiovascular: Regular rate and rhythm. No murmurs, gallops, or rubs. 2+ symmetrical distal pulses are present . No JVD. No LE edema Respiratory: Respiratory effort normal .Lungs sounds clear bilaterally. No wheezes, crackles, or rhonchi.  Gastrointestinal: Soft, non tender, and non distended with positive bowel sounds.  Genitourinary: No CVA tenderness. Musculoskeletal: Nontender with normal range of motion in all extremities. No cyanosis, or erythema of extremities. Neurologic:  Face is symmetric. Moving all extremities. No gross focal neurologic deficits . Skin: Skin is warm, dry.  No rash or ulcers Psychiatric: Mood and affect are normal   Labs on Admission: I have personally reviewed following labs and imaging studies  CBC: Recent Labs  Lab 07/22/20 1000  WBC 12.0*  NEUTROABS 6.9  HGB 16.1  HCT 48.8  MCV 89.5  PLT 214   Basic Metabolic Panel: Recent Labs  Lab 07/22/20 1000  NA 140  K 4.1  CL 107  CO2 25  GLUCOSE 106*  BUN 18  CREATININE 0.76  CALCIUM 8.7*   GFR: Estimated Creatinine Clearance: 148.2 mL/min (by C-G formula based on SCr of 0.76 mg/dL). Liver Function Tests: Recent Labs  Lab 07/22/20 1000  AST 21  ALT 24  ALKPHOS 65  BILITOT 0.8  PROT 7.9  ALBUMIN 4.6   No results for input(s): LIPASE, AMYLASE in the last 168 hours. No results for input(s): AMMONIA in the last 168 hours. Coagulation Profile: Recent Labs  Lab 07/22/20 1000  INR 1.0   Cardiac Enzymes: No results for input(s): CKTOTAL, CKMB, CKMBINDEX, TROPONINI in the last 168 hours. BNP (last 3 results) No results for input(s): PROBNP in the last 8760 hours. HbA1C: No results for  input(s): HGBA1C in the last 72 hours. CBG: Recent Labs  Lab 07/22/20 0956  GLUCAP 122*   Lipid Profile: No results for input(s): CHOL, HDL, LDLCALC, TRIG, CHOLHDL, LDLDIRECT in the last 72 hours. Thyroid Function Tests: No results for input(s): TSH, T4TOTAL, FREET4, T3FREE, THYROIDAB in the last 72 hours. Anemia Panel: No results for input(s): VITAMINB12, FOLATE, FERRITIN, TIBC, IRON, RETICCTPCT in the last 72 hours. Urine analysis: No results found for: COLORURINE, APPEARANCEUR, LABSPEC, PHURINE, GLUCOSEU, HGBUR, BILIRUBINUR, KETONESUR, PROTEINUR, UROBILINOGEN, NITRITE, LEUKOCYTESUR  Radiological Exams on Admission: CT HEAD WO CONTRAST  Result Date: 07/22/2020 CLINICAL DATA:  Left-sided numbness since yesterday morning EXAM: CT HEAD WITHOUT CONTRAST TECHNIQUE: Contiguous axial images were  obtained from the base of the skull through the vertex without intravenous contrast. COMPARISON:  None. FINDINGS: Brain: No evidence of acute infarction, hemorrhage, hydrocephalus, extra-axial collection or mass lesion/mass effect. Vascular: No hyperdense vessel or unexpected calcification. Skull: Normal. Negative for fracture or focal lesion. Sinuses/Orbits: No acute finding. Other: None IMPRESSION: Negative Electronically Signed   By: Corlis Leak M.D.   On: 07/22/2020 10:24   MR BRAIN W WO CONTRAST  Result Date: 07/22/2020 CLINICAL DATA:  Left arm and leg numbness EXAM: MRI HEAD WITHOUT AND WITH CONTRAST TECHNIQUE: Multiplanar, multiecho pulse sequences of the brain and surrounding structures were obtained without and with intravenous contrast. CONTRAST:  26mL GADAVIST GADOBUTROL 1 MMOL/ML IV SOLN COMPARISON:  None. FINDINGS: Brain: Several foci of T2 hyperintensity are present in the supratentorial periventricular and subcortical white matter no brainstem or cerebellar involvement identified. There is associated enhancement of a right centrum semiovale lesion on series 23, image 114; right corona radiata  lesion on image 96; left posterior temporal lesion on image 75; left anterior temporal lesion on image 65. There is no acute infarction or intracranial hemorrhage. There is no intracranial mass or significant mass effect. There is no hydrocephalus or extra-axial fluid collection. Ventricles and sulci are normal in size and configuration. Vascular: Major vessel flow voids at the skull base are preserved. Skull and upper cervical spine: Normal marrow signal is preserved. Sinuses/Orbits: Paranasal sinuses are aerated. Orbits are unremarkable. Other: Sella is unremarkable.  Mastoid air cells are clear. IMPRESSION: Mild burden of supratentorial white matter lesions in a pattern suspicious for primary demyelinating disease such as multiple sclerosis. Some of the lesions demonstrate enhancement consistent with active demyelination in this context. No brainstem or cerebellar involvement. Electronically Signed   By: Guadlupe Spanish M.D.   On: 07/22/2020 12:38   MR CERVICAL SPINE W CONTRAST  Result Date: 07/22/2020 CLINICAL DATA:  Left arm and leg numbness EXAM: MRI CERVICAL AND THORACIC WITH CONTRAST TECHNIQUE: Multiplanar and multiecho pulse sequences of the cervical spine, to include the craniocervical junction and cervicothoracic junction, and the thoracic spine, were obtained with intravenous contrast. CONTRAST:  39mL GADAVIST GADOBUTROL 1 MMOL/ML IV SOLN administered for brain MRI prior to this study COMPARISON:  None. FINDINGS: MRI CERVICAL SPINE FINDINGS Alignment: Preserved. Vertebrae: Vertebral body heights are maintained. Cord: No abnormal signal or enhancement. Posterior Fossa, vertebral arteries, paraspinal tissues: Intracranial findings are discussed separately. Otherwise unremarkable. Disc levels: Intervertebral disc heights and signal are maintained. Minor disc bulge at C4-C5. No canal or foraminal stenosis at any level. MRI THORACIC SPINE Alignment:  Preserved. Vertebrae: Vertebral body heights are  maintained. Cord:  No definite abnormal signal.  No abnormal enhancement. Paraspinal and other soft tissues: Unremarkable. Disc levels: Intervertebral disc heights and signal are maintained. There is no significant disc herniation. No canal or foraminal stenosis at any level. IMPRESSION: No abnormal cord signal or enhancement. Electronically Signed   By: Guadlupe Spanish M.D.   On: 07/22/2020 13:14   MR THORACIC SPINE W CONTRAST  Result Date: 07/22/2020 CLINICAL DATA:  Left arm and leg numbness EXAM: MRI CERVICAL AND THORACIC WITH CONTRAST TECHNIQUE: Multiplanar and multiecho pulse sequences of the cervical spine, to include the craniocervical junction and cervicothoracic junction, and the thoracic spine, were obtained with intravenous contrast. CONTRAST:  78mL GADAVIST GADOBUTROL 1 MMOL/ML IV SOLN administered for brain MRI prior to this study COMPARISON:  None. FINDINGS: MRI CERVICAL SPINE FINDINGS Alignment: Preserved. Vertebrae: Vertebral body heights are maintained. Cord: No abnormal signal or  enhancement. Posterior Fossa, vertebral arteries, paraspinal tissues: Intracranial findings are discussed separately. Otherwise unremarkable. Disc levels: Intervertebral disc heights and signal are maintained. Minor disc bulge at C4-C5. No canal or foraminal stenosis at any level. MRI THORACIC SPINE Alignment:  Preserved. Vertebrae: Vertebral body heights are maintained. Cord:  No definite abnormal signal.  No abnormal enhancement. Paraspinal and other soft tissues: Unremarkable. Disc levels: Intervertebral disc heights and signal are maintained. There is no significant disc herniation. No canal or foraminal stenosis at any level. IMPRESSION: No abnormal cord signal or enhancement. Electronically Signed   By: Guadlupe Spanish M.D.   On: 07/22/2020 13:14     Assessment/Plan Principal Problem:   Multiple sclerosis (HCC) Active Problems:   Nicotine dependence     Multiple sclerosis New onset Patient presents for  evaluation of numbness involving his left upper extremity, left lower extremitywhich has waxed and waned in intensity MRI of the brain shows mild burden of supratentorial white matter lesions in a pattern suspicious for primary demyelinating disease such as multiple sclerosis. Some of the lesions demonstrate enhancement consistent with active demyelination in this context. We will consult neurology Patient was started on high-dose steroids Request PT/OT evaluation    Nicotine dependence Smoking cessation has been discussed with patient He declines a nicotine transdermal patch at this time  DVT prophylaxis: Lovenox Code Status: full code Family Communication: Greater than 50% of time was spent discussing patient's condition and plan of care with him at the bedside.  All questions and concerns have been addressed.  He verbalizes understanding and agrees to the plan. Disposition Plan: Back to previous home environment Consults called: Neurology Status: Observation    Tochukwu Agbata MD [facility]     07/22/2020, 2:21 PM

## 2020-07-23 DIAGNOSIS — F17213 Nicotine dependence, cigarettes, with withdrawal: Secondary | ICD-10-CM

## 2020-07-23 LAB — CBC
HCT: 46 % (ref 39.0–52.0)
Hemoglobin: 15.7 g/dL (ref 13.0–17.0)
MCH: 29.7 pg (ref 26.0–34.0)
MCHC: 34.1 g/dL (ref 30.0–36.0)
MCV: 87 fL (ref 80.0–100.0)
Platelets: 198 10*3/uL (ref 150–400)
RBC: 5.29 MIL/uL (ref 4.22–5.81)
RDW: 13.4 % (ref 11.5–15.5)
WBC: 20.8 10*3/uL — ABNORMAL HIGH (ref 4.0–10.5)
nRBC: 0 % (ref 0.0–0.2)

## 2020-07-23 LAB — BASIC METABOLIC PANEL
Anion gap: 8 (ref 5–15)
BUN: 15 mg/dL (ref 6–20)
CO2: 21 mmol/L — ABNORMAL LOW (ref 22–32)
Calcium: 9.1 mg/dL (ref 8.9–10.3)
Chloride: 109 mmol/L (ref 98–111)
Creatinine, Ser: 0.73 mg/dL (ref 0.61–1.24)
GFR, Estimated: >60 mL/min (ref >60)
Glucose, Bld: 143 mg/dL — ABNORMAL HIGH (ref 70–99)
Potassium: 4.4 mmol/L (ref 3.5–5.1)
Sodium: 138 mmol/L (ref 135–145)

## 2020-07-23 MED ORDER — METHYLPREDNISOLONE 32 MG PO TABS: 1250.0000 mg | ORAL_TABLET | Freq: Every day | ORAL | 0 refills | Status: DC

## 2020-07-23 MED ORDER — PANTOPRAZOLE SODIUM 40 MG PO TBEC: 40.0000 mg | DELAYED_RELEASE_TABLET | Freq: Two times a day (BID) | ORAL | 0 refills | Status: AC

## 2020-07-23 MED ORDER — SODIUM CHLORIDE 0.9 % IV SOLN
1000.0000 mg | Freq: Once | INTRAVENOUS | Status: AC
  Administered 2020-07-23: 1000 mg via INTRAVENOUS
  Filled 2020-07-23 (×2): qty 8

## 2020-07-23 NOTE — Progress Notes (Signed)
OT Cancellation Note  Patient Details Name: Clayton Cruz MRN: [id number] DOB: [birth year]   Cancelled Treatment:    Reason Eval/Treat Not Completed: OT screened, no needs identified, will sign off. Order received, chart reviewed. Pt endorses some impaired sensation however, appears to be at baseline independence with ADLs and functional mobility. No skilled OT needs identified at this time. Will sign off. Please re-consult if additional needs arise.   Matthew Folks, OTR/L [facility] [phone]

## 2020-07-23 NOTE — Discharge Summary (Signed)
Clayton Cruz SVX:[id number] DOB: 03/21/1991 DOA: 07/22/2020  PCP: System, Provider Not In  Admit date: 07/22/2020 Discharge date: 07/23/2020  Time spent: 35 min minutes  Recommendations for Outpatient Follow-up:  1. Close neurology f/u     Discharge Diagnoses:  Principal Problem:   Multiple sclerosis (HCC) Active Problems:   Nicotine dependence   Discharge Condition: good  Diet recommendation: regular  Filed Weights   07/22/20 1413 07/23/20 0100  Weight: 81.6 kg 84.1 kg    History of present illness:  Clayton Cruz is a 30 y.o. male with medical history significant for nicotine dependence who presents to the emergency room for evaluation of a 1 day history of left arm/leg numbness as well as numbness involving the left side of his face.  Symptoms started 1 day prior to his admission and he thought he had a pinched nerve.  Its been constant but waxing and waning in severity.  He states he had an episode of slurred speech which resolved but denies having any trouble swallowing, no blurred vision or weakness involving any of his extremities.  He called to make an appointment with a chiropractor thinking he had a pinched nerve but was advised to come to the ER for evaluation. He denies having any headache, no fever, no chills, no nausea, no vomiting, no abdominal pain, no dizziness, no lightheadedness, no palpitations, no diaphoresis, no chest pain, no shortness of breath, no urinary frequency, no nocturia, no dysuria.  Hospital Course:  Presented with left sided numbness, weakness, and trouble with coordination. Aunt with MS. MRI consistent with MS flare. Treated with IV solumedrol for 2 days, plan for discharge home with high dose methylprednisolone for 3 more days and close neurology f/u. Symptoms much improved day of discharge w/ start of high-dose steroids. Smoking cessation discussed.  Procedures:  none   Consultations:  neurology  Discharge Exam: Vitals:   07/23/20 0546  07/23/20 0753  BP: 132/60 131/66  Pulse: 75 86  Resp: 18 18  Temp: (!) 97.3 F (36.3 C) 98.6 F (37 C)  SpO2: 96% 97%    General: NAD Cardiovascular: rrr Respiratory: ctab Neuro: 5/5 b/l strength, ambulates without difficulty  Discharge Instructions   Discharge Instructions    Diet - low sodium heart healthy   Complete by: As directed    Increase activity slowly   Complete by: As directed      Allergies as of 07/23/2020   No Known Allergies     Medication List    TAKE these medications   albuterol 108 (90 Base) MCG/ACT inhaler Commonly known as: VENTOLIN HFA Inhale 2 puffs into the lungs 3 (three) times daily as needed for wheezing or shortness of breath.   methylPREDNISolone 32 MG tablet Commonly known as: MEDROL Take 39 tablets (1,250 mg total) by mouth daily for 3 days. Yes you really do need to take this many tablets. Begin on 07/25/2019   pantoprazole 40 MG tablet Commonly known as: PROTONIX Take 1 tablet (40 mg total) by mouth 2 (two) times daily for 5 days.      No Known Allergies  Follow-up Information    Erick Blinks, MD Follow up.   Specialty: Neurology Why: This is the neurologist who saw you in the hospital - his office is in [employer] information: [address] [place] [address] [phone]        Lonell Face, MD Follow up.   Specialty: Neurology Why: This is the contact information for  a local neurologist Contact information: [address] [facility] [address] [facility] [address] [facility] [place] [address] [phone]        Your primary care doctor. Schedule an appointment as soon as possible for a visit.                The results of significant diagnostics from this hospitalization (including imaging, microbiology, ancillary and laboratory) are listed below for reference.    Significant Diagnostic Studies: CT HEAD WO CONTRAST  Result Date: 07/22/2020 CLINICAL DATA:  Left-sided numbness since  yesterday morning EXAM: CT HEAD WITHOUT CONTRAST TECHNIQUE: Contiguous axial images were obtained from the base of the skull through the vertex without intravenous contrast. COMPARISON:  None. FINDINGS: Brain: No evidence of acute infarction, hemorrhage, hydrocephalus, extra-axial collection or mass lesion/mass effect. Vascular: No hyperdense vessel or unexpected calcification. Skull: Normal. Negative for fracture or focal lesion. Sinuses/Orbits: No acute finding. Other: None IMPRESSION: Negative Electronically Signed   By: Corlis Leak M.D.   On: 07/22/2020 10:24   MR BRAIN W WO CONTRAST  Result Date: 07/22/2020 CLINICAL DATA:  Left arm and leg numbness EXAM: MRI HEAD WITHOUT AND WITH CONTRAST TECHNIQUE: Multiplanar, multiecho pulse sequences of the brain and surrounding structures were obtained without and with intravenous contrast. CONTRAST:  39mL GADAVIST GADOBUTROL 1 MMOL/ML IV SOLN COMPARISON:  None. FINDINGS: Brain: Several foci of T2 hyperintensity are present in the supratentorial periventricular and subcortical white matter no brainstem or cerebellar involvement identified. There is associated enhancement of a right centrum semiovale lesion on series 23, image 114; right corona radiata lesion on image 96; left posterior temporal lesion on image 75; left anterior temporal lesion on image 65. There is no acute infarction or intracranial hemorrhage. There is no intracranial mass or significant mass effect. There is no hydrocephalus or extra-axial fluid collection. Ventricles and sulci are normal in size and configuration. Vascular: Major vessel flow voids at the skull base are preserved. Skull and upper cervical spine: Normal marrow signal is preserved. Sinuses/Orbits: Paranasal sinuses are aerated. Orbits are unremarkable. Other: Sella is unremarkable.  Mastoid air cells are clear. IMPRESSION: Mild burden of supratentorial white matter lesions in a pattern suspicious for primary demyelinating disease such as  multiple sclerosis. Some of the lesions demonstrate enhancement consistent with active demyelination in this context. No brainstem or cerebellar involvement. Electronically Signed   By: Guadlupe Spanish M.D.   On: 07/22/2020 12:38   MR CERVICAL SPINE W CONTRAST  Result Date: 07/22/2020 CLINICAL DATA:  Left arm and leg numbness EXAM: MRI CERVICAL AND THORACIC WITH CONTRAST TECHNIQUE: Multiplanar and multiecho pulse sequences of the cervical spine, to include the craniocervical junction and cervicothoracic junction, and the thoracic spine, were obtained with intravenous contrast. CONTRAST:  23mL GADAVIST GADOBUTROL 1 MMOL/ML IV SOLN administered for brain MRI prior to this study COMPARISON:  None. FINDINGS: MRI CERVICAL SPINE FINDINGS Alignment: Preserved. Vertebrae: Vertebral body heights are maintained. Cord: No abnormal signal or enhancement. Posterior Fossa, vertebral arteries, paraspinal tissues: Intracranial findings are discussed separately. Otherwise unremarkable. Disc levels: Intervertebral disc heights and signal are maintained. Minor disc bulge at C4-C5. No canal or foraminal stenosis at any level. MRI THORACIC SPINE Alignment:  Preserved. Vertebrae: Vertebral body heights are maintained. Cord:  No definite abnormal signal.  No abnormal enhancement. Paraspinal and other soft tissues: Unremarkable. Disc levels: Intervertebral disc heights and signal are maintained. There is no significant disc herniation. No canal or foraminal stenosis at any level. IMPRESSION: No abnormal cord signal or enhancement. Electronically Signed  By: Guadlupe Spanish M.D.   On: 07/22/2020 13:14   MR THORACIC SPINE W CONTRAST  Result Date: 07/22/2020 CLINICAL DATA:  Left arm and leg numbness EXAM: MRI CERVICAL AND THORACIC WITH CONTRAST TECHNIQUE: Multiplanar and multiecho pulse sequences of the cervical spine, to include the craniocervical junction and cervicothoracic junction, and the thoracic spine, were obtained with  intravenous contrast. CONTRAST:  13mL GADAVIST GADOBUTROL 1 MMOL/ML IV SOLN administered for brain MRI prior to this study COMPARISON:  None. FINDINGS: MRI CERVICAL SPINE FINDINGS Alignment: Preserved. Vertebrae: Vertebral body heights are maintained. Cord: No abnormal signal or enhancement. Posterior Fossa, vertebral arteries, paraspinal tissues: Intracranial findings are discussed separately. Otherwise unremarkable. Disc levels: Intervertebral disc heights and signal are maintained. Minor disc bulge at C4-C5. No canal or foraminal stenosis at any level. MRI THORACIC SPINE Alignment:  Preserved. Vertebrae: Vertebral body heights are maintained. Cord:  No definite abnormal signal.  No abnormal enhancement. Paraspinal and other soft tissues: Unremarkable. Disc levels: Intervertebral disc heights and signal are maintained. There is no significant disc herniation. No canal or foraminal stenosis at any level. IMPRESSION: No abnormal cord signal or enhancement. Electronically Signed   By: Guadlupe Spanish M.D.   On: 07/22/2020 13:14    Microbiology: Recent Results (from the past 240 hour(s))  Resp Panel by RT-PCR (Flu A&B, Covid) Nasopharyngeal Swab     Status: None   Collection Time: 07/22/20  2:13 PM   Specimen: Nasopharyngeal Swab; Nasopharyngeal(NP) swabs in vial transport medium  Result Value Ref Range Status   SARS Coronavirus 2 by RT PCR NEGATIVE NEGATIVE Final    Comment: (NOTE) SARS-CoV-2 target nucleic acids are NOT DETECTED.  The SARS-CoV-2 RNA is generally detectable in upper respiratory specimens during the acute phase of infection. The lowest concentration of SARS-CoV-2 viral copies this assay can detect is 138 copies/mL. A negative result does not preclude SARS-Cov-2 infection and should not be used as the sole basis for treatment or other patient management decisions. A negative result may occur with  improper specimen collection/handling, submission of specimen other than nasopharyngeal  swab, presence of viral mutation(s) within the areas targeted by this assay, and inadequate number of viral copies(<138 copies/mL). A negative result must be combined with clinical observations, patient history, and epidemiological information. The expected result is Negative.  Fact Sheet for Patients:  BloggerCourse.com  Fact Sheet for Healthcare Providers:  SeriousBroker.it  This test is no t yet approved or cleared by the [place] FDA and  has been authorized for detection and/or diagnosis of SARS-CoV-2 by FDA under an Emergency Use Authorization (EUA). This EUA will remain  in effect (meaning this test can be used) for the duration of the COVID-19 declaration under Section 564(b)(1) of the Act, 21 U.S.C.section 360bbb-3(b)(1), unless the authorization is terminated  or revoked sooner.       Influenza A by PCR NEGATIVE NEGATIVE Final   Influenza B by PCR NEGATIVE NEGATIVE Final    Comment: (NOTE) The Xpert Xpress SARS-CoV-2/FLU/RSV plus assay is intended as an aid in the diagnosis of influenza from Nasopharyngeal swab specimens and should not be used as a sole basis for treatment. Nasal washings and aspirates are unacceptable for Xpert Xpress SARS-CoV-2/FLU/RSV testing.  Fact Sheet for Patients: BloggerCourse.com  Fact Sheet for Healthcare Providers: SeriousBroker.it  This test is not yet approved or cleared by the [place] FDA and has been authorized for detection and/or diagnosis of SARS-CoV-2 by FDA under an Emergency Use Authorization (EUA). This EUA will remain  in effect (meaning this test can be used) for the duration of the COVID-19 declaration under Section 564(b)(1) of the Act, 21 U.S.C. section 360bbb-3(b)(1), unless the authorization is terminated or revoked.  Performed at [facility], [address] [place], [place] [address]       Labs: Basic Metabolic Panel: Recent Labs  Lab 07/22/20 1000 07/23/20 0419  NA 140 138  K 4.1 4.4  CL 107 109  CO2 25 21*  GLUCOSE 106* 143*  BUN 18 15  CREATININE 0.76 0.73  CALCIUM 8.7* 9.1   Liver Function Tests: Recent Labs  Lab 07/22/20 1000  AST 21  ALT 24  ALKPHOS 65  BILITOT 0.8  PROT 7.9  ALBUMIN 4.6   No results for input(s): LIPASE, AMYLASE in the last 168 hours. No results for input(s): AMMONIA in the last 168 hours. CBC: Recent Labs  Lab 07/22/20 1000 07/23/20 0419  WBC 12.0* 20.8*  NEUTROABS 6.9  --   HGB 16.1 15.7  HCT 48.8 46.0  MCV 89.5 87.0  PLT 214 198   Cardiac Enzymes: No results for input(s): CKTOTAL, CKMB, CKMBINDEX, TROPONINI in the last 168 hours. BNP: BNP (last 3 results) No results for input(s): BNP in the last 8760 hours.  ProBNP (last 3 results) No results for input(s): PROBNP in the last 8760 hours.  CBG: Recent Labs  Lab 07/22/20 0956  GLUCAP 122*       Signed:  Silvano Bilis MD.  [facility] 07/23/2020, 8:52 AM

## 2020-07-23 NOTE — Evaluation (Signed)
Physical Therapy Evaluation Patient Details Name: Clayton Cruz MRN: [id number] DOB: [birth year] Today's Date: 07/23/2020   History of Present Illness  Presented with left sided numbness, weakness, and trouble with coordination. Aunt with MS. MRI consistent with MS flare.  Clinical Impression  Pt seen for PT evaluation with pt demonstrating independence with gait, mod I with stair negotiation with rail. Pt does endorse numbness & impaired sensation in LUE/LLE and impaired coordination LUE but notes this has improved since yesterday. PT educates pt on heat exacerbating MS symptoms & encouraged pt to be aware of this with upcoming summer months. At this time pt does not require any PT services. Will sign off, but please re-consult if new needs arise.     Follow Up Recommendations No PT follow up    Equipment Recommendations  None recommended by PT    Recommendations for Other Services       Precautions / Restrictions Precautions Precautions: None Restrictions Weight Bearing Restrictions: No      Mobility  Bed Mobility Overal bed mobility: Modified Independent             General bed mobility comments: HOB elevated    Transfers Overall transfer level: Modified independent               General transfer comment: sit<>stand  Ambulation/Gait Ambulation/Gait assistance: Independent Gait Distance (Feet): 200 Feet Assistive device: None   Gait velocity: WNL   General Gait Details: slightly increased L hip/knee flexion during swing phase, pt notes this is 2/2 numbness in extremity  Stairs Stairs: Yes Stairs assistance: Modified independent (Device/Increase time) Stair Management:  (R descending/ L ascending) Number of Stairs: 7 General stair comments: step over step pattern  Wheelchair Mobility    Modified Rankin (Stroke Patients Only)       Balance                                             Pertinent Vitals/Pain Pain Assessment:  No/denies pain    Home Living Family/patient expects to be discharged to:: Private residence Living Arrangements: Parent;Children (adult parents & 25 y/o daughter) Available Help at Discharge: Family Type of Home: House Home Access: Stairs to enter Entrance Stairs-Rails: None Entrance Stairs-Number of Steps: "half step" + 2 steps Home Layout: Multi-level Home Equipment: None      Prior Function Level of Independence: Independent         Comments: working in [employer], driving (drives ~8,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)     Hand Dominance   Dominant Hand: Right    Extremity/Trunk Assessment   Upper Extremity Assessment Upper Extremity Assessment: LUE deficits/detail LUE Deficits / Details: decreased coordination with finger to nose, pt endorses numbness in extremity but notes it's improved since yesterday, strength & ROM overall WFL/WNL    Lower Extremity Assessment Lower Extremity Assessment: LLE deficits/detail LLE Deficits / Details: 5/5 hip flexion, knee extension & ankle dorsiflexion in sitting, significantly decreased sensation to light touch in L quad, slightly decreased sensation in LLE distal to L knee, proprioception intact, ROM WNL       Communication   Communication: No difficulties  Cognition Arousal/Alertness: Awake/alert Behavior During Therapy: WFL for tasks assessed/performed Overall Cognitive Status: Within Functional Limits for tasks assessed  General Comments      Exercises     Assessment/Plan    PT Assessment Patent does not need any further PT services  PT Problem List         PT Treatment Interventions      PT Goals (Current goals can be found in the Care Plan section)  Acute Rehab PT Goals Patient Stated Goal: return home/PLOF PT Goal Formulation: With patient Time For Goal Achievement: 08/06/20 Potential to Achieve Goals: Good    Frequency     Barriers to discharge         Co-evaluation               AM-PAC PT "6 Clicks" Mobility  Outcome Measure Help needed turning from your back to your side while in a flat bed without using bedrails?: None Help needed moving from lying on your back to sitting on the side of a flat bed without using bedrails?: None Help needed moving to and from a bed to a chair (including a wheelchair)?: None Help needed standing up from a chair using your arms (e.g., wheelchair or bedside chair)?: None Help needed to walk in hospital room?: None Help needed climbing 3-5 steps with a railing? : None 6 Click Score: 24    End of Session   Activity Tolerance: Patient tolerated treatment well Patient left: in bed;with call bell/phone within reach        Time: 0922-0932 PT Time Calculation (min) (ACUTE ONLY): 10 min   Charges:   PT Evaluation $PT Eval Low Complexity: 1 Low          Aleda Grana, PT, DPT 07/23/20, 9:54 AM   Sandi Mariscal 07/23/2020, 9:53 AM

## 2020-07-23 NOTE — Progress Notes (Addendum)
Refused Lovenox SQ. Pt verbalized understanding reason for medication. States he will get up and walk frequently. Patient observed walking around the unit frequently the whole shift. Anna Genre, APP and attending Ashok Pall, MD notified.

## 2020-07-23 NOTE — Progress Notes (Signed)
Discharge instructions discussed with pt, pt verbalized understanding, prescriptions were e-prescribed. Pt waiting on solumedrol Infusion to complete.

## 2020-07-23 NOTE — Progress Notes (Addendum)
NEUROLOGY CONSULTATION PROGRESS NOTE   Date of service: July 23, 2020 Patient Name: Clayton Cruz MRN:  [id number] DOB:  [birth year]  Brief HPI  Clayton Cruz is a 30 y.o. male with no significant PMH who presents with LUE and LLE numbness and incoordination. Workup with MRI Brain, C, T spine with and without contrast is consistent with a diagnosis of MS and notable for 2 gadolinium enhancing lesions concerning for active demyelination. Started on IV Steroids. He does have a family hx of MS and stroke.    Interval Hx   Endorses smoking but he is very committed to quitting right now. Declined nicotine patches but does have nicotine gum at home. Vit D levels are normal althou low normal. Discussed that he would benefit from a diet containing all food groups along with a multivitamin tablet. Also discussed starting DMT within a couple weeks to prevent MS attacks in future.  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 The next day, woke up with these symptoms. I discussed that there is some concern that stress might play a role in triggering attacks and it might be best to take a break from it for 2 weeks.  Vitals   Vitals:   07/22/20 2120 07/23/20 0100 07/23/20 0546 07/23/20 0753  BP: (!) 142/64  132/60 131/66  Pulse: 74  75 86  Resp: 18  18 18   Temp: 98.9 F (37.2 C)  (!) 97.3 F (36.3 C) 98.6 F (37 C)  TempSrc: Oral  Oral Oral  SpO2: 99%  96% 97%  Weight:  84.1 kg    Height:         Body mass index is 25.13 kg/m.  Physical Exam   General: Laying comfortably in bed; in no acute distress. HENT: Normal oropharynx and mucosa. Normal external appearance of ears and nose. Neck: Supple, no pain or tenderness CV: No JVD. No peripheral edema. Pulmonary: Symmetric Chest rise. Normal respiratory effort. Abdomen: Soft to touch, non-tender. Ext: No cyanosis, edema, or deformity Skin: No rash. Normal palpation of skin.   Musculoskeletal: Normal digits and nails by inspection. No clubbing.  Neurologic Examination  Mental status/Cognition: Alert, interactive. Engages in conversation. Speech/language: Fluent, comprehension intact Cranial nerves:   CN II    CN III,IV,VI EOM intact, no gaze preference or deviation, no nystagmus   CN V    CN VII no asymmetry, no nasolabial fold flattening   CN VIII normal hearing to speech   CN IX & X    CN XI    CN XII    Motor:  Muscle bulk: normal, tone normal Mvmt Root Nerve  Muscle Right Left Comments  SA C5/6 Ax Deltoid     EF C5/6 Mc Biceps     EE C6/7/8 Rad Triceps     WF C6/7 Med FCR     WE C7/8 PIN ECU     F Ab C8/T1 U ADM/FDI 5 5   HF L1/2/3 Fem Illopsoas 5 5   KE L2/3/4 Fem Quad     DF L4/5 D Peron Tib Ant 5 5   PF S1/2 Tibial Grc/Sol      Sensation:  Light touch Decreased milding in L forearm with tingling in his fingers. Decreased in left medial foot but improved from yesterday.   Pin prick    Temperature    Vibration   Proprioception    Coordination/Complex Motor:  -  Finger to Nose with mild ataxia in LUE - Heel to shin with no ataxia. - Rapid alternating movement are normal - Gait: deferred.  Labs   Basic Metabolic Panel:  Lab Results  Component Value Date   NA 138 07/23/2020   K 4.4 07/23/2020   CO2 21 (L) 07/23/2020   GLUCOSE 143 (H) 07/23/2020   BUN 15 07/23/2020   CREATININE 0.73 07/23/2020   CALCIUM 9.1 07/23/2020   GFRNONAA >60 07/23/2020   HbA1c: No results found for: HGBA1C LDL: No results found for: [facility] Urine Drug Screen: No results found for: LABOPIA, COCAINSCRNUR, LABBENZ, AMPHETMU, THCU, LABBARB  Alcohol Level No results found for: ETH No results found for: PHENYTOIN, ZONISAMIDE, LAMOTRIGINE, LEVETIRACETA No results found for: PHENYTOIN, PHENOBARB, VALPROATE, CBMZ  Imaging and Diagnostic studies   MRI Brain w and w/o contrast:  Mild burden of supratentorial white matter lesions in a pattern suspicious for  primary demyelinating disease such as multiple sclerosis. Some of the lesions demonstrate enhancement consistent with active demyelination in this context.  MR Cervical spine w and w/o contrast: No abnormal cord signal or enhancement.  MR T spine w and w/o contrast: No abnormal cord signal or enhancement.  Impression   Clayton Cruz is a 30 y.o. male with no significant PMH who presents with LUE and LLE numbness and incoordination that developed over the course of 24-48 hours. His presentation with left sided symptoms coupled with the MRI findings are most consistent with Multiple Sclerosis. Given presence of gadolinium enhancing lesions on MRI, he was started on IV Steroids with significant improvement in his symptoms after first dose.  Recommendations  - Will do IV Solumedrol 1000mg  x 1 now - PO Methylprednisone 1250mg  daily x 3 days on 3/7, 3/8, 3/9. - PPI while on steroids. - Vit D levels normal - althou lower end of the spectrum. - Very committed to quitting smoking and getting back to doing exercise and I did discuss that this will be really helpful. - Recommend follow up with Neurology outpatient in 1-2 weeks to discuss disease-modifying therapy. - Neurology inpatient team will signoff. Please feel free to contact 5/8 with any questions or concerns. _________________________________________________________________    Thank you for the opportunity to take part in the care of this patient. If you have any further questions, please contact the neurology consultation attending.  Signed,  5/9 [facility] Pager Number [phone] _ _ _   _ __   _ __ _ _  __ __   _ __   __ _

## 2020-07-24 ENCOUNTER — Other Ambulatory Visit: Payer: Self-pay | Admitting: Obstetrics and Gynecology

## 2020-07-24 MED ORDER — METHYLPREDNISOLONE 32 MG PO TABS: 1250.0000 mg | ORAL_TABLET | Freq: Every day | ORAL | 0 refills | Status: AC

## 2020-07-24 MED ORDER — METHYLPREDNISOLONE 32 MG PO TABS: 1250.0000 mg | ORAL_TABLET | Freq: Every day | ORAL | 0 refills | Status: DC

## 2020-07-24 NOTE — Telephone Encounter (Signed)
Pt unable to get [facility]s [facility] at [facility] today, there was a delay then they said they don't have it, got in touch w/ our pharmacy, they can get it by tomorrow, sending rx there, pt made aware.

## 2020-07-25 ENCOUNTER — Other Ambulatory Visit: Payer: Self-pay | Admitting: Obstetrics and Gynecology
